# Patient Record
Sex: Female | Born: 1985 | Race: Black or African American | Hispanic: No | Marital: Married | Smoking: Never smoker
Health system: Southern US, Community
[De-identification: ages and names within clinical notes are randomized; demographics above are authoritative.]

## PROBLEM LIST (undated history)

## (undated) ENCOUNTER — Inpatient Hospital Stay (HOSPITAL_COMMUNITY): Payer: Self-pay

## (undated) DIAGNOSIS — Z789 Other specified health status: Secondary | ICD-10-CM

## (undated) HISTORY — PX: NO PAST SURGERIES: SHX2092

---

## 2008-02-28 ENCOUNTER — Inpatient Hospital Stay (HOSPITAL_COMMUNITY): Admission: AD | Admit: 2008-02-28 | Discharge: 2008-03-01 | Payer: Self-pay | Admitting: Obstetrics and Gynecology

## 2011-01-02 NOTE — H&P (Signed)
NAMESUEANN, BROWNLEY                   ACCOUNT NO.:  [id number]   MEDICAL RECORD NO.:  [id number]          PATIENT TYPE:  MAT   LOCATION:  [facility]                          FACILITY:  [facility]   PHYSICIAN:  Osborn Coho, M.D.   DATE OF BIRTH:  05-Feb-1986   DATE OF ADMISSION:  02/28/2008  DATE OF DISCHARGE:                              HISTORY & PHYSICAL   HISTORY:  Ms. Kuri is a 25 year old married black female primigravida  at 48 and 6/7th weeks who presents with leaking clear fluid since 3.45  a.m. with onset of mild contraction since then.  She denies bleeding.  Denies nausea, vomiting, or diarrhea.  No headaches.  Her pregnancy has  been followed by the [facility] Cert[facility]-Midwife  [facility] and has been essentially unremarkable.  Her group B strep status  is negative.   Her prenatal labs were collected on September 12, 2007.  Hemoglobin 12.2,  hematocrit 35.9, platelets 247,000, blood type O positive, antibody  negative, sickle cell trait negative, rubella immune, hepatitis B  surface antigen negative, HIV nonreactive.  Pap smear was within normal  limits.  Gonorrhea negative.  Chlamydia negative.  One-hour Glucola from  December 08, 2007, was 38.  Culture of the vaginal tract for group B strep  in the third trimester is negative.   HISTORY OF PRESENT PREGNANCY:  The patient presented for care at [facility] on September 12, 2007 at 14-5/7th weeks' gestation.  Ultrasound  was done with that visit for dating purposes and best EDC was  established at March 07, 2008.  Anatomy ultrasound at 18-1/2 weeks' shows  growth consistent with previous dating, all anatomy was seen.  Rest of  her prenatal care has been unremarkable.  She has remained size equal to  dates and normotensive with no proteinuria throughout the pregnancy.   OB HISTORY:  She is a primigravida.   PAST MEDICAL HISTORY:  She has no medication allergies.  She experienced  menarche at the age 57 with 30 days  cycles lasting 7 days.  She has  taken Yaz in the past for contraception and stopped in September 2008.  She was vaccinated against the chickenpox but has never had them.   SURGICAL HISTORY:  Negative.   FAMILY MEDICAL HISTORY:  Father is on medication for chronic  hypertension.  Maternal grandmother is deceased but did have diabetes  requiring insulin.   GENETIC HISTORY:  Negative.   SOCIAL HISTORY:  The patient is married.  Father of the baby's name is  [place].  The patient is college educated and is a part-time  Scientist, research (medical).  The father of the baby has a Higher education careers adviser and works  full time for an Leisure centre manager.  They deny any alcohol, tobacco,  or illicit drug use with the pregnancy.   OBJECTIVE DATA:  Vital signs are stable.  She is afebrile.  HEENT:  Grossly within normal limits.  CHEST:  Clear to auscultation.  HEART:  Regular rate and rhythm.  ABDOMEN:  Gravid in contour with fundal height extending approximately  38-cm above the pubic  symphysis.  Fetal heart rate is reactive and  reassuring.  Contractions every 3-4 minutes and very mild.  Cervix is 2  cm, 90% vertex, -2 with clear fluid, positive for Nitrazine and positive  for ferning.  EXTREMITIES:  Normal.   ASSESSMENT:  1. Intrauterine pregnancy at term.  2. Premature rupture of membranes.  3. Group B streptococcus negative.   PLAN:  1. Plan is to admit to birthing suite.  2. Routine standing orders.  3. Plan expectant management for now.  4. Would like epidural as labor progresses.      Cam Hai, C.N.M.      Osborn Coho, M.D.  Electronically Signed    KS/MEDQ  D:  02/28/2008  T:  02/28/2008  Job:  [id number]

## 2011-05-17 LAB — CBC
HCT: 42.3
Hemoglobin: 10.9 — ABNORMAL LOW
Hemoglobin: 14.4
MCHC: 33.6
MCV: 93.9
RBC: 3.46 — ABNORMAL LOW
RBC: 4.56
WBC: 17 — ABNORMAL HIGH
WBC: 17.5 — ABNORMAL HIGH

## 2012-01-01 ENCOUNTER — Encounter: Payer: Self-pay | Admitting: Obstetrics and Gynecology

## 2012-01-02 ENCOUNTER — Encounter: Payer: Self-pay | Admitting: Obstetrics and Gynecology

## 2012-01-02 ENCOUNTER — Ambulatory Visit (INDEPENDENT_AMBULATORY_CARE_PROVIDER_SITE_OTHER): Payer: 59 | Admitting: Obstetrics and Gynecology

## 2012-01-02 VITALS — BP 100/70 | HR 72 | Ht 66.0 in | Wt 141.0 lb

## 2012-01-02 DIAGNOSIS — Z9189 Other specified personal risk factors, not elsewhere classified: Secondary | ICD-10-CM

## 2012-01-02 DIAGNOSIS — Z01419 Encounter for gynecological examination (general) (routine) without abnormal findings: Secondary | ICD-10-CM

## 2012-01-02 DIAGNOSIS — Z87448 Personal history of other diseases of urinary system: Secondary | ICD-10-CM

## 2012-01-02 DIAGNOSIS — Z124 Encounter for screening for malignant neoplasm of cervix: Secondary | ICD-10-CM

## 2012-01-02 DIAGNOSIS — Z113 Encounter for screening for infections with a predominantly sexual mode of transmission: Secondary | ICD-10-CM

## 2012-01-02 DIAGNOSIS — I10 Essential (primary) hypertension: Secondary | ICD-10-CM

## 2012-01-02 MED ORDER — DROSPIRENONE-ETHINYL ESTRADIOL 3-0.02 MG PO TABS: 1.0000 | ORAL_TABLET | Freq: Every day | ORAL | Status: DC

## 2012-01-02 NOTE — Progress Notes (Signed)
Regular Periods: yes Mammogram: no  Monthly Breast Ex.: yes Exercise: yes  Tetanus < 10 years: no Seatbelts: yes  NI. Bladder Functn.: yes Abuse at home: no  Daily BM's: yes Stressful Work: yes  Healthy Diet: yes Sigmoid-Colonoscopy: no  Calcium: no Medical problems this year: no problem   LAST PAP:6/12 nl  Contraception: GIANVI  Mammogram:  NEVER  PCP: NO  PMH: NO CHANGE  FMH: NO CHANGE  Last Bone Scan: NEVER

## 2012-01-02 NOTE — Progress Notes (Signed)
Subjective:    Susan Fleming is a 26 y.o. female, G1P1, who presents for an annual exam. The patient reports no complaints.  Wants to continue Gianvi.  Menstrual cycle:   LMP: Patient's last menstrual period was 12/22/2011. normal flow per patient             Review of Systems Pertinent items are noted in HPI. Denies pelvic pain, uti symptoms, vaginitis symptoms, irregular bleeding, menopausal symptoms, change in bowel habits or rectal bleeding   Objective:    BP 100/70  Pulse 72  Ht 5\' 6"  (1.676 m)  Wt 141 lb (63.957 kg)  BMI 22.76 kg/m2  LMP 12/22/2011  Wt Readings from Last 1 Encounters:  01/02/12 141 lb (63.957 kg)  : Body mass index is 22.76 kg/(m^2). General Appearance: Alert, appropriate appearance for age. No acute distress HEENT: Grossly normal Neck / Thyroid: Supple, no thyromegaly or cervical adenopathy Lungs: clear to auscultation bilaterally Back: No CVA tenderness Breast Exam: No masses or nodes.No dimpling, nipple retraction or discharge. Cardiovascular: Regular rate and rhythm.  Gastrointestinal: Soft, non-tender, no masses or organomegaly Pelvic Exam: EGBUS-wnl, vagina-normal rugae, cervix- without lesions or tenderness, uterus appears normal size shape and consistency, adnexae-no masses or tenderness Lymphatic Exam: Non-palpable nodes in neck,  axillary, or inguinal regions  Skin: no rashes or abnormalities Extremities: no clubbing cyanosis or edema  Neurologic: grossly normal Psychiatric: Alert and oriented    Assessment:   Routine GYN Exam   Plan:    PAP-sent STD testing Continue Gianvi #3 1 po qd 4 refills   Meeah Totino,ELMIRAPA-C

## 2012-01-03 LAB — HEPATITIS B SURFACE ANTIGEN: Hepatitis B Surface Ag: NEGATIVE

## 2012-01-03 LAB — HIV ANTIBODY (ROUTINE TESTING W REFLEX): HIV: NONREACTIVE

## 2012-01-05 LAB — PAP IG, CT-NG, RFX HPV ASCU
Chlamydia Probe Amp: NEGATIVE
GC Probe Amp: NEGATIVE

## 2012-01-15 ENCOUNTER — Telehealth: Payer: Self-pay | Admitting: Obstetrics and Gynecology

## 2012-01-15 NOTE — Telephone Encounter (Signed)
Triage/elect. 

## 2012-01-15 NOTE — Telephone Encounter (Signed)
INFORMED PT THAT ALL OF TEST RESULTS WERE NORMAL. PT VOICED UNDERSTANDING  LM

## 2012-03-10 ENCOUNTER — Other Ambulatory Visit: Payer: Self-pay | Admitting: Obstetrics and Gynecology

## 2012-03-10 ENCOUNTER — Other Ambulatory Visit: Payer: Self-pay

## 2012-03-11 ENCOUNTER — Other Ambulatory Visit: Payer: Self-pay | Admitting: Obstetrics and Gynecology

## 2012-03-11 MED ORDER — DROSPIRENONE-ETHINYL ESTRADIOL 3-0.02 MG PO TABS: 1.0000 | ORAL_TABLET | Freq: Every day | ORAL | Status: DC

## 2012-03-11 NOTE — Telephone Encounter (Signed)
Pt called, states [facility] says that her rx was cancelled by ordering provider so she did not have any rfs remaining.  Pt had her Aex in 12/2011 and was given a rx for a year.  Rx for Loestrin [facility] for pt until next Aex.

## 2012-03-11 NOTE — Telephone Encounter (Signed)
Triage/epic 

## 2012-08-04 ENCOUNTER — Telehealth: Payer: Self-pay | Admitting: Obstetrics and Gynecology

## 2012-08-04 NOTE — Telephone Encounter (Signed)
TC to pt. States has vaginal odor.  Scheduled with SL for eval 08/05/12.

## 2012-08-05 ENCOUNTER — Encounter: Payer: Self-pay | Admitting: Obstetrics and Gynecology

## 2012-08-05 ENCOUNTER — Ambulatory Visit (INDEPENDENT_AMBULATORY_CARE_PROVIDER_SITE_OTHER): Payer: 59 | Admitting: Obstetrics and Gynecology

## 2012-08-05 ENCOUNTER — Encounter: Payer: 59 | Admitting: Obstetrics and Gynecology

## 2012-08-05 VITALS — BP 120/72 | Resp 16 | Wt 138.0 lb

## 2012-08-05 DIAGNOSIS — N898 Other specified noninflammatory disorders of vagina: Secondary | ICD-10-CM

## 2012-08-05 DIAGNOSIS — N949 Unspecified condition associated with female genital organs and menstrual cycle: Secondary | ICD-10-CM

## 2012-08-05 DIAGNOSIS — Z0000 Encounter for general adult medical examination without abnormal findings: Secondary | ICD-10-CM

## 2012-08-05 LAB — HEPATITIS C ANTIBODY: HCV Ab: NEGATIVE

## 2012-08-05 LAB — HIV ANTIBODY (ROUTINE TESTING W REFLEX): HIV: NONREACTIVE

## 2012-08-05 MED ORDER — METRONIDAZOLE 500 MG PO TABS: 500.0000 mg | ORAL_TABLET | Freq: Two times a day (BID) | ORAL | Status: DC

## 2012-08-05 NOTE — Patient Instructions (Signed)
Bacterial Vaginosis Bacterial vaginosis (BV) is a vaginal infection where the normal balance of bacteria in the vagina is disrupted. The normal balance is then replaced by an overgrowth of certain bacteria. There are several different kinds of bacteria that can cause BV. BV is the most common vaginal infection in women of childbearing age. CAUSES   The cause of BV is not fully understood. BV develops when there is an increase or imbalance of harmful bacteria.  Some activities or behaviors can upset the normal balance of bacteria in the vagina and put women at increased risk including:  Having a new sex partner or multiple sex partners.  Douching.  Using an intrauterine device (IUD) for contraception.  It is not clear what role sexual activity plays in the development of BV. However, women that have never had sexual intercourse are rarely infected with BV. Women do not get BV from toilet seats, bedding, swimming pools or from touching objects around them.  SYMPTOMS   Grey vaginal discharge.  A fish-like odor with discharge, especially after sexual intercourse.  Itching or burning of the vagina and vulva.  Burning or pain with urination.  Some women have no signs or symptoms at all. DIAGNOSIS  Your caregiver must examine the vagina for signs of BV. Your caregiver will perform lab tests and look at the sample of vaginal fluid through a microscope. They will look for bacteria and abnormal cells (clue cells), a pH test higher than 4.5, and a positive amine test all associated with BV.  RISKS AND COMPLICATIONS   Pelvic inflammatory disease (PID).  Infections following gynecology surgery.  Developing HIV.  Developing herpes virus. TREATMENT  Sometimes BV will clear up without treatment. However, all women with symptoms of BV should be treated to avoid complications, especially if gynecology surgery is planned. Female partners generally do not need to be treated. However, BV may spread  between female sex partners so treatment is helpful in preventing a recurrence of BV.   BV may be treated with antibiotics. The antibiotics come in either pill or vaginal cream forms. Either can be used with nonpregnant or pregnant women, but the recommended dosages differ. These antibiotics are not harmful to the baby.  BV can recur after treatment. If this happens, a second round of antibiotics will often be prescribed.  Treatment is important for pregnant women. If not treated, BV can cause a premature delivery, especially for a pregnant woman who had a premature birth in the past. All pregnant women who have symptoms of BV should be checked and treated.  For chronic reoccurrence of BV, treatment with a type of prescribed gel vaginally twice a week is helpful. HOME CARE INSTRUCTIONS   Finish all medication as directed by your caregiver.  Do not have sex until treatment is completed.  Tell your sexual partner that you have a vaginal infection. They should see their caregiver and be treated if they have problems, such as a mild rash or itching.  Practice safe sex. Use condoms. Only have 1 sex partner. PREVENTION  Basic prevention steps can help reduce the risk of upsetting the natural balance of bacteria in the vagina and developing BV:  Do not have sexual intercourse (be abstinent).  Do not douche.  Use all of the medicine prescribed for treatment of BV, even if the signs and symptoms go away.  Tell your sex partner if you have BV. That way, they can be treated, if needed, to prevent reoccurrence. SEEK MEDICAL CARE IF:     Your symptoms are not improving after 3 days of treatment.  You have increased discharge, pain, or fever. MAKE SURE YOU:   Understand these instructions.  Will watch your condition.  Will get help right away if you are not doing well or get worse. FOR MORE INFORMATION  Division of STD Prevention (DSTDP), Centers for Disease Control and Prevention:  www.cdc.gov/std American Social Health Association (ASHA): www.ashastd.org  Document Released: 08/06/2005 Document Revised: 10/29/2011 Document Reviewed: 01/27/2009 ExitCare Patient Information 2013 [employer], LLC.  

## 2012-08-05 NOTE — Progress Notes (Signed)
HISTORY OF PRESENT ILLNESS  [name] is a 26 y.o. year old female,G1P1, who presents for a problem visit. The patient complains of a vaginal discharge with a vaginal odor.  She wants to rule out sexual transmitted infections. She has a history of a false positive hepatitis C.  Objective:  BP 120/72  Resp 16  Wt 138 lb (62.596 kg)  LMP 07/26/2012   General: no distress GI: soft and nontender  External genitalia: normal general appearance Vaginal: normal without tenderness, induration or masses Cervix: normal appearance Adnexa: normal bimanual exam Uterus: normal size, nontender  Wet prep: Positive clue cells, pH 4.5, Whiff neg  Assessment:  Bacterial vaginosis  Rule out sexual transmitted infections  Plan:  Metronidazole 500 mg twice each day for 7 days  GC, Chlamydia, HIV, HBsAg, hepatitis C, RPR, HSV 1, HSV-2  Return to office prn if symptoms worsen or fail to improve.   Susan Fleming M.D.  08/05/2012 10:02 PM    Vaginal discharge: whitethick mucoid Itching / Burning: no Fever: no  Symptoms have been present for 1 month off and on. Has used over-the-counter treatment: douche Associated symptoms:  Pelvic pain: no       Dyspareunia: no     Odor:  yes  History of STD:  no history of PID, STD's STD screen:requested

## 2012-08-06 LAB — HSV 2 ANTIBODY, IGG: HSV 2 Glycoprotein G Ab, IgG: 0.3 IV

## 2012-08-06 LAB — GC/CHLAMYDIA PROBE AMP: GC Probe RNA: NEGATIVE

## 2012-08-22 ENCOUNTER — Telehealth: Payer: Self-pay | Admitting: Obstetrics and Gynecology

## 2012-08-22 NOTE — Telephone Encounter (Signed)
Spoke with pt rgd results informed labs wnl pt voice understanding

## 2012-08-25 ENCOUNTER — Telehealth: Payer: Self-pay | Admitting: Obstetrics and Gynecology

## 2012-08-25 NOTE — Telephone Encounter (Signed)
APT SCHEDULED FOR Friday 09/05/12 WITH EP.  Darien Ramus, CMA

## 2012-09-05 ENCOUNTER — Encounter: Payer: 59 | Admitting: Obstetrics and Gynecology

## 2013-12-17 ENCOUNTER — Ambulatory Visit (INDEPENDENT_AMBULATORY_CARE_PROVIDER_SITE_OTHER): Payer: BC Managed Care – PPO | Admitting: Internal Medicine

## 2013-12-17 ENCOUNTER — Encounter: Payer: Self-pay | Admitting: Internal Medicine

## 2013-12-17 VITALS — BP 134/77 | HR 118 | Temp 98.5°F | Ht 66.0 in | Wt 167.0 lb

## 2013-12-17 DIAGNOSIS — Z789 Other specified health status: Secondary | ICD-10-CM

## 2013-12-17 NOTE — Progress Notes (Signed)
   Subjective:    Patient ID: Susan Fleming, female    DOB: 06/16/1986, 28 y.o.   MRN: [id number]  HPI She comes in for a new patient evaluation of hepatitis B serology.  She is under the care of Nigel BridgemanVicki Latham, CNM and trying to get pregnant.  She had a positive hepatitis B surface Ab.  She has a history of hepatitis B surface Ag negative test x 2 in 2013.  She recalls a vaccine series in middle school, not sure of what.  Born [place].  No hepatitis known in family.  Feels well.  Husband tested negative for hepatitis B.  No travel history.  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     Review of Systems  Constitutional: Negative for fatigue and unexpected weight change.  HENT:       No icterus  Gastrointestinal: Negative for abdominal pain and diarrhea.  Skin:       No jaundice       Objective:   Physical Exam  Constitutional: She appears well-developed and well-nourished. No distress.  Eyes: No scleral icterus.  Neurological: She is alert.  Skin: No rash noted.  Psychiatric: She has a normal mood and affect. Her behavior is normal.          Assessment & Plan:

## 2013-12-17 NOTE — Assessment & Plan Note (Signed)
Hepatitis B immune with positive antibody.  Likely from successful vaccine series.  Will check core Ab to see if remote disease.  Had SurfaceAg negative in 2013 x 2 so I would doubt she has acquired a new infection but will repeat Ag again to confirm.    Return PRN

## 2013-12-18 ENCOUNTER — Telehealth: Payer: Self-pay | Admitting: *Deleted

## 2013-12-18 LAB — HEPATITIS B CORE ANTIBODY, TOTAL: HEP B C TOTAL AB: NONREACTIVE

## 2013-12-18 LAB — HEPATITIS B SURFACE ANTIGEN: Hepatitis B Surface Ag: NEGATIVE

## 2013-12-18 NOTE — Telephone Encounter (Signed)
Patient notified, copy of result mailed to her and copy faxed to referring provider Erin SonsVickie Latham at [phone]. Wendall MolaJacqueline Cockerham

## 2013-12-18 NOTE — Telephone Encounter (Signed)
Message copied by Macy MisOCKERHAM, JACQUELINE A on Fri Dec 18, 2013 12:10 PM ------      Message from: Gardiner BarefootOMER, ROBERT W      Created: Fri Dec 18, 2013 11:23 AM       Please let her know that her hepatitis B tests confirm that she does not have active nor old hepatitis B infection and her initial test represents immunity from previous vaccine only, as expected.             Thanks ------

## 2013-12-29 ENCOUNTER — Ambulatory Visit: Payer: Self-pay | Admitting: Internal Medicine

## 2014-01-04 LAB — OB RESULTS CONSOLE ABO/RH: RH TYPE: POSITIVE

## 2014-01-04 LAB — OB RESULTS CONSOLE GC/CHLAMYDIA
Chlamydia: NEGATIVE
Gonorrhea: NEGATIVE

## 2014-01-04 LAB — OB RESULTS CONSOLE RUBELLA ANTIBODY, IGM: Rubella: IMMUNE

## 2014-01-08 ENCOUNTER — Encounter: Payer: Self-pay | Admitting: Internal Medicine

## 2014-01-12 ENCOUNTER — Telehealth: Payer: Self-pay | Admitting: *Deleted

## 2014-01-12 NOTE — Telephone Encounter (Signed)
Referral sent from patient's OB for hepatitis B. Patient has already been seen by Dr. Luciana Axe and does not have Hepatitis B, just positive antibody from vaccine. Office notified and office note and labs refaxed to Erin Sons, midwife. Wendall Mola

## 2014-01-15 ENCOUNTER — Inpatient Hospital Stay (HOSPITAL_COMMUNITY): Payer: BC Managed Care – PPO

## 2014-01-15 ENCOUNTER — Encounter (HOSPITAL_COMMUNITY): Payer: Self-pay | Admitting: *Deleted

## 2014-01-15 ENCOUNTER — Inpatient Hospital Stay (HOSPITAL_COMMUNITY)
Admission: AD | Admit: 2014-01-15 | Discharge: 2014-01-16 | Disposition: A | Discharge status: Home / Self Care | Payer: BC Managed Care – PPO | Source: Ambulatory Visit | Attending: Obstetrics and Gynecology | Admitting: Obstetrics and Gynecology

## 2014-01-15 DIAGNOSIS — O209 Hemorrhage in early pregnancy, unspecified: Secondary | ICD-10-CM | POA: Insufficient documentation

## 2014-01-15 HISTORY — DX: Other specified health status: Z78.9

## 2014-01-15 LAB — WET PREP, GENITAL
Trich, Wet Prep: NONE SEEN
YEAST WET PREP: NONE SEEN

## 2014-01-15 NOTE — MAU Provider Note (Signed)
History    Patient is a 28yo G2P1001 at 8wks based on LMP of 11/21/13 who presents with c/o vaginal bleeding this pm.  Patient states that she went to the bathroom and upon wiping noted some bright red blood on the tissue.  Patient denies current or recent abdominal cramping as well as sexual intercourse.  Patient states that she wore a pad to hospital, but it was not stained with any blood.  Patient denies recent illness, GI, and urination issues.    Patient Active Problem List   Diagnosis Date Noted  . Hepatitis B immune 12/17/2013    Chief Complaint  Patient presents with  . Vaginal Bleeding   HPI  OB History   Grav Para Term Preterm Abortions TAB SAB Ect Mult Living   2 1 1       1       Past Medical History  Diagnosis Date  . Medical history non-contributory     Past Surgical History  Procedure Laterality Date  . No past surgeries      History reviewed. No pertinent family history.  History  Substance Use Topics  . Smoking status: Never Smoker   . Smokeless tobacco: Never Used  . Alcohol Use: No    Allergies: No Known Allergies  Prescriptions prior to admission  Medication Sig Dispense Refill  . acetaminophen (TYLENOL) 325 MG tablet Take 650 mg by mouth every 6 (six) hours as needed.      . Prenatal Vit-Fe Fumarate-FA (PRENATAL MULTIVITAMIN) TABS tablet Take 1 tablet by mouth daily at 12 noon.      . Biotin 10 MG TABS Take by mouth.      . drospirenone-ethinyl estradiol (YAZ,GIANVI,LORYNA) 3-0.02 MG tablet Take 1 tablet by mouth daily.  3 Package  3  . Methylsulfonylmethane (MSM PO) Take 200 mg by mouth once.      . metroNIDAZOLE (FLAGYL) 500 MG tablet Take 1 tablet (500 mg total) by mouth 2 (two) times daily. Take tablets  for 7 days  14 tablet  0    ROS  See HPI Above Physical Exam   Blood pressure 157/67, pulse 106, temperature 98.9 F (37.2 C), resp. rate 18, height 5\' 6"  (1.676 m), weight 168 lb (76.204 kg), last menstrual period 11/21/2013. Results for  orders placed during the hospital encounter of 01/15/14 (from the past 24 hour(s))  WET PREP, GENITAL     Status: Abnormal   Collection Time    01/15/14 10:00 PM      Result Value Ref Range   Yeast Wet Prep HPF POC NONE SEEN  NONE SEEN   Trich, Wet Prep NONE SEEN  NONE SEEN   Clue Cells Wet Prep HPF POC FEW (*) NONE SEEN   WBC, Wet Prep HPF POC MODERATE (*) NONE SEEN    Physical Exam  ED Course  Assessment: IUP at 8wks Vaginal Bleeding  Plan: -Labs: Gc/CT, Wet Prep -[place] for Viability   Follow Up (0010) -Preliminary [place] viewed-FHR of 178 noted -Bacterial Vaginosis noted on wet prep, RX sent -Bleeding precautions discussed -Patient to follow up in office as scheduled -Call if you have any questions or concerns prior to your next visit.    Gerrit Heck CNM, MSN 01/15/2014 9:52 PM

## 2014-01-15 NOTE — MAU Note (Signed)
I went to [facility] an hour ago and saw bright red blood on tissue when i wiped. NO pain

## 2014-01-16 LAB — GC/CHLAMYDIA PROBE AMP
CT Probe RNA: NEGATIVE
GC Probe RNA: NEGATIVE

## 2014-01-16 MED ORDER — METRONIDAZOLE 500 MG PO TABS
500.0000 mg | ORAL_TABLET | Freq: Two times a day (BID) | ORAL | Status: DC
Start: 2014-01-16 — End: 2014-08-26

## 2014-01-16 NOTE — Discharge Instructions (Signed)
Bacterial Vaginosis Bacterial vaginosis is a vaginal infection that occurs when the normal balance of bacteria in the vagina is disrupted. It results from an overgrowth of certain bacteria. This is the most common vaginal infection in women of childbearing age. Treatment is important to prevent complications, especially in pregnant women, as it can cause a premature delivery. CAUSES  Bacterial vaginosis is caused by an increase in harmful bacteria that are normally present in smaller amounts in the vagina. Several different kinds of bacteria can cause bacterial vaginosis. However, the reason that the condition develops is not fully understood. RISK FACTORS Certain activities or behaviors can put you at an increased risk of developing bacterial vaginosis, including:  Having a new sex partner or multiple sex partners.  Douching.  Using an intrauterine device (IUD) for contraception. Women do not get bacterial vaginosis from toilet seats, bedding, swimming pools, or contact with objects around them. SIGNS AND SYMPTOMS  Some women with bacterial vaginosis have no signs or symptoms. Common symptoms include:  Grey vaginal discharge.  A fishlike odor with discharge, especially after sexual intercourse.  Itching or burning of the vagina and vulva.  Burning or pain with urination. DIAGNOSIS  Your health care provider will take a medical history and examine the vagina for signs of bacterial vaginosis. A sample of vaginal fluid may be taken. Your health care provider will look at this sample under a microscope to check for bacteria and abnormal cells. A vaginal pH test may also be done.  TREATMENT  Bacterial vaginosis may be treated with antibiotic medicines. These may be given in the form of a pill or a vaginal cream. A second round of antibiotics may be prescribed if the condition comes back after treatment.  HOME CARE INSTRUCTIONS   Only take over-the-counter or prescription medicines as  directed by your health care provider.  If antibiotic medicine was prescribed, take it as directed. Make sure you finish it even if you start to feel better.  Do not have sex until treatment is completed.  Tell all sexual partners that you have a vaginal infection. They should see their health care provider and be treated if they have problems, such as a mild rash or itching.  Practice safe sex by using condoms and only having one sex partner. SEEK MEDICAL CARE IF:   Your symptoms are not improving after 3 days of treatment.  You have increased discharge or pain.  You have a fever. MAKE SURE YOU:   Understand these instructions.  Will watch your condition.  Will get help right away if you are not doing well or get worse. FOR MORE INFORMATION  Centers for Disease Control and Prevention, Division of STD Prevention: SolutionApps.co.zawww.cdc.gov/std American Sexual Health Association (ASHA): www.ashastd.org  Document Released: 08/06/2005 Document Revised: 05/27/2013 Document Reviewed: 03/18/2013 [facility] Patient Information 2014 [place], [place]. Vaginal Bleeding During Pregnancy, First Trimester A small amount of bleeding (spotting) from the vagina is relatively common in early pregnancy. It usually stops on its own. Various things may cause bleeding or spotting in early pregnancy. Some bleeding may be related to the pregnancy, and some may not. In most cases, the bleeding is normal and is not a problem. However, bleeding can also be a sign of something serious. Be sure to tell your health care provider about any vaginal bleeding right away. Some possible causes of vaginal bleeding during the first trimester include:  Infection or inflammation of the cervix.  Growths (polyps) on the cervix.  Miscarriage or threatened miscarriage.  Pregnancy tissue has developed outside of the uterus and in a fallopian tube (tubal pregnancy).  Tiny cysts have developed in the uterus instead of pregnancy tissue  (molar pregnancy). HOME CARE INSTRUCTIONS  Watch your condition for any changes. The following actions may help to lessen any discomfort you are feeling:  Follow your health care provider's instructions for limiting your activity. If your health care provider orders bed rest, you may need to stay in bed and only get up to use the bathroom. However, your health care provider may allow you to continue light activity.  If needed, make plans for someone to help with your regular activities and responsibilities while you are on bed rest.  Keep track of the number of pads you use each day, how often you change pads, and how soaked (saturated) they are. Write this down.  Do not use tampons. Do not douche.  Do not have sexual intercourse or orgasms until approved by your health care provider.  If you pass any tissue from your vagina, save the tissue so you can show it to your health care provider.  Only take over-the-counter or prescription medicines as directed by your health care provider.  Do not take aspirin because it can make you bleed.  Keep all follow-up appointments as directed by your health care provider. SEEK MEDICAL CARE IF:  You have any vaginal bleeding during any part of your pregnancy.  You have cramps or labor pains. SEEK IMMEDIATE MEDICAL CARE IF:   You have severe cramps in your back or belly (abdomen).  You have a fever, not controlled by medicine.  You pass large clots or tissue from your vagina.  Your bleeding increases.  You feel lightheaded or weak, or you have fainting episodes.  You have chills.  You are leaking fluid or have a gush of fluid from your vagina.  You pass out while having a bowel movement. MAKE SURE YOU:  Understand these instructions.  Will watch your condition.  Will get help right away if you are not doing well or get worse. Document Released: 05/16/2005 Document Revised: 05/27/2013 Document Reviewed: 04/13/2013 [facility] Patient  Information 2014 [place], [place].

## 2014-01-20 ENCOUNTER — Ambulatory Visit: Payer: BC Managed Care – PPO | Admitting: Internal Medicine

## 2014-05-28 LAB — OB RESULTS CONSOLE RPR: RPR: NONREACTIVE

## 2014-05-28 LAB — OB RESULTS CONSOLE HIV ANTIBODY (ROUTINE TESTING): HIV: NONREACTIVE

## 2014-06-21 ENCOUNTER — Encounter (HOSPITAL_COMMUNITY): Payer: Self-pay | Admitting: *Deleted

## 2014-08-20 NOTE — L&D Delivery Note (Signed)
Operative Delivery Note At 10:26 AM a viable female was delivered via Vaginal, Vacuum Investment banker, operational(Extractor).  Presentation: vertex; Position: Left,, Occiput,, Anterior; Station: +2.  Verbal consent: obtained from patient.  Risks and benefits discussed in detail.  Risks include, but are not limited to the risks of anesthesia, bleeding, infection, damage to maternal tissues, fetal cephalhematoma.  There is also the risk of inability to effect vaginal delivery of the head, or shoulder dystocia that cannot be resolved by established maneuvers, leading to the need for emergency cesarean section.  Vacuum pumped to a pressure of 50mmHg in green zone.  Baby delivered with vacuum assistance with one push.  Indication for vacuum assistance decel/bradycardia.  APGAR: 9, 9; weight hasn't been weighed yet. Placenta status: Intact, Spontaneous.   Cord: 3 vessels with the following complications: None.  Cord pH: n/a  Anesthesia: Epidural  Instruments: Vacuum Episiotomy: None Lacerations: Periurethral Suture Repair: 4-0 vicryl Est. Blood Loss (mL):  500cc Mom to postpartum.  Baby to Couplet care / Skin to Skin.  Kathleen Likins Y 08/26/2014, 10:50 AM

## 2014-08-26 ENCOUNTER — Encounter (HOSPITAL_COMMUNITY): Payer: Self-pay | Admitting: *Deleted

## 2014-08-26 ENCOUNTER — Inpatient Hospital Stay (HOSPITAL_COMMUNITY): Payer: 59 | Admitting: Anesthesiology

## 2014-08-26 ENCOUNTER — Inpatient Hospital Stay (HOSPITAL_COMMUNITY)
Admission: AD | Admit: 2014-08-26 | Discharge: 2014-08-28 | DRG: 775 | Disposition: A | Discharge status: Home / Self Care | Payer: 59 | Source: Ambulatory Visit | Attending: Obstetrics & Gynecology | Admitting: Obstetrics & Gynecology

## 2014-08-26 DIAGNOSIS — Z3A39 39 weeks gestation of pregnancy: Secondary | ICD-10-CM | POA: Diagnosis present

## 2014-08-26 LAB — CBC
HCT: 38.3 % (ref 36.0–46.0)
Hemoglobin: 13.8 g/dL (ref 12.0–15.0)
MCH: 32 pg (ref 26.0–34.0)
MCHC: 36 g/dL (ref 30.0–36.0)
MCV: 88.9 fL (ref 78.0–100.0)
PLATELETS: 133 10*3/uL — AB (ref 150–400)
RBC: 4.31 MIL/uL (ref 3.87–5.11)
RDW: 14.7 % (ref 11.5–15.5)
WBC: 18.6 10*3/uL — ABNORMAL HIGH (ref 4.0–10.5)

## 2014-08-26 LAB — TYPE AND SCREEN
ABO/RH(D): O POS
Antibody Screen: NEGATIVE

## 2014-08-26 LAB — OB RESULTS CONSOLE GBS: GBS: NEGATIVE

## 2014-08-26 LAB — ABO/RH: ABO/RH(D): O POS

## 2014-08-26 LAB — RPR

## 2014-08-26 MED ORDER — DIPHENHYDRAMINE HCL 50 MG/ML IJ SOLN: 12.5000 mg | INTRAMUSCULAR | Status: DC | PRN

## 2014-08-26 MED ORDER — EPHEDRINE 5 MG/ML INJ
10.0000 mg | INTRAVENOUS | Status: DC | PRN
  Filled 2014-08-26: qty 2

## 2014-08-26 MED ORDER — PHENYLEPHRINE 40 MCG/ML (10ML) SYRINGE FOR IV PUSH (FOR BLOOD PRESSURE SUPPORT)
80.0000 ug | PREFILLED_SYRINGE | INTRAVENOUS | Status: DC | PRN
  Filled 2014-08-26: qty 20
  Filled 2014-08-26: qty 2

## 2014-08-26 MED ORDER — ZOLPIDEM TARTRATE 5 MG PO TABS: 5.0000 mg | ORAL_TABLET | Freq: Every evening | ORAL | Status: DC | PRN

## 2014-08-26 MED ORDER — LACTATED RINGERS IV SOLN
500.0000 mL | Freq: Once | INTRAVENOUS | Status: AC
  Administered 2014-08-26: 500 mL via INTRAVENOUS

## 2014-08-26 MED ORDER — DIBUCAINE 1 % RE OINT: 1.0000 "application " | TOPICAL_OINTMENT | RECTAL | Status: DC | PRN

## 2014-08-26 MED ORDER — LACTATED RINGERS IV SOLN: 500.0000 mL | INTRAVENOUS | Status: DC | PRN

## 2014-08-26 MED ORDER — LIDOCAINE HCL (PF) 1 % IJ SOLN
30.0000 mL | INTRAMUSCULAR | Status: AC | PRN
  Administered 2014-08-26: 30 mL via SUBCUTANEOUS
  Filled 2014-08-26: qty 30

## 2014-08-26 MED ORDER — BENZOCAINE-MENTHOL 20-0.5 % EX AERO
1.0000 "application " | INHALATION_SPRAY | CUTANEOUS | Status: DC | PRN
  Administered 2014-08-26: 1 via TOPICAL
  Filled 2014-08-26: qty 56

## 2014-08-26 MED ORDER — OXYCODONE-ACETAMINOPHEN 5-325 MG PO TABS: 1.0000 | ORAL_TABLET | ORAL | Status: DC | PRN

## 2014-08-26 MED ORDER — NALBUPHINE HCL 10 MG/ML IJ SOLN: 5.0000 mg | INTRAMUSCULAR | Status: DC | PRN

## 2014-08-26 MED ORDER — OXYCODONE-ACETAMINOPHEN 5-325 MG PO TABS
2.0000 | ORAL_TABLET | ORAL | Status: DC | PRN
Start: 2014-08-26 — End: 2014-08-26

## 2014-08-26 MED ORDER — LIDOCAINE HCL (PF) 1 % IJ SOLN
INTRAMUSCULAR | Status: DC | PRN
  Administered 2014-08-26: 4 mL
  Administered 2014-08-26: 6 mL

## 2014-08-26 MED ORDER — ONDANSETRON HCL 4 MG/2ML IJ SOLN: 4.0000 mg | Freq: Four times a day (QID) | INTRAMUSCULAR | Status: DC | PRN

## 2014-08-26 MED ORDER — OXYTOCIN 40 UNITS IN LACTATED RINGERS INFUSION - SIMPLE MED
62.5000 mL/h | INTRAVENOUS | Status: DC
  Administered 2014-08-26: 999 mL/h via INTRAVENOUS
  Filled 2014-08-26: qty 1000

## 2014-08-26 MED ORDER — SENNOSIDES-DOCUSATE SODIUM 8.6-50 MG PO TABS
2.0000 | ORAL_TABLET | ORAL | Status: DC
  Administered 2014-08-26 – 2014-08-27 (×2): 2 via ORAL
  Filled 2014-08-26 (×2): qty 2

## 2014-08-26 MED ORDER — PRENATAL MULTIVITAMIN CH
1.0000 | ORAL_TABLET | Freq: Every day | ORAL | Status: DC
  Administered 2014-08-27 – 2014-08-28 (×2): 1 via ORAL
  Filled 2014-08-26 (×2): qty 1

## 2014-08-26 MED ORDER — FENTANYL 2.5 MCG/ML BUPIVACAINE 1/10 % EPIDURAL INFUSION (WH - ANES)
14.0000 mL/h | INTRAMUSCULAR | Status: DC | PRN
  Administered 2014-08-26: 14 mL/h via EPIDURAL
  Filled 2014-08-26: qty 125

## 2014-08-26 MED ORDER — IBUPROFEN 600 MG PO TABS
600.0000 mg | ORAL_TABLET | Freq: Four times a day (QID) | ORAL | Status: DC
  Administered 2014-08-26 – 2014-08-28 (×8): 600 mg via ORAL
  Filled 2014-08-26 (×9): qty 1

## 2014-08-26 MED ORDER — CITRIC ACID-SODIUM CITRATE 334-500 MG/5ML PO SOLN: 30.0000 mL | ORAL | Status: DC | PRN

## 2014-08-26 MED ORDER — TETANUS-DIPHTH-ACELL PERTUSSIS 5-2.5-18.5 LF-MCG/0.5 IM SUSP
0.5000 mL | Freq: Once | INTRAMUSCULAR | Status: DC
Start: 2014-08-27 — End: 2014-08-28

## 2014-08-26 MED ORDER — ACETAMINOPHEN 325 MG PO TABS: 650.0000 mg | ORAL_TABLET | ORAL | Status: DC | PRN

## 2014-08-26 MED ORDER — FENTANYL 2.5 MCG/ML BUPIVACAINE 1/10 % EPIDURAL INFUSION (WH - ANES): 14.0000 mL/h | INTRAMUSCULAR | Status: DC | PRN

## 2014-08-26 MED ORDER — ONDANSETRON HCL 4 MG PO TABS: 4.0000 mg | ORAL_TABLET | ORAL | Status: DC | PRN

## 2014-08-26 MED ORDER — SIMETHICONE 80 MG PO CHEW
80.0000 mg | CHEWABLE_TABLET | ORAL | Status: DC | PRN
Start: 2014-08-26 — End: 2014-08-28

## 2014-08-26 MED ORDER — OXYTOCIN BOLUS FROM INFUSION: 500.0000 mL | INTRAVENOUS | Status: DC

## 2014-08-26 MED ORDER — ONDANSETRON HCL 4 MG/2ML IJ SOLN: 4.0000 mg | INTRAMUSCULAR | Status: DC | PRN

## 2014-08-26 MED ORDER — LACTATED RINGERS IV SOLN
INTRAVENOUS | Status: DC
  Administered 2014-08-26 (×2): via INTRAVENOUS

## 2014-08-26 MED ORDER — LANOLIN HYDROUS EX OINT: TOPICAL_OINTMENT | CUTANEOUS | Status: DC | PRN

## 2014-08-26 MED ORDER — OXYCODONE-ACETAMINOPHEN 5-325 MG PO TABS: 2.0000 | ORAL_TABLET | ORAL | Status: DC | PRN

## 2014-08-26 MED ORDER — WITCH HAZEL-GLYCERIN EX PADS: 1.0000 "application " | MEDICATED_PAD | CUTANEOUS | Status: DC | PRN

## 2014-08-26 MED ORDER — DIPHENHYDRAMINE HCL 25 MG PO CAPS: 25.0000 mg | ORAL_CAPSULE | Freq: Four times a day (QID) | ORAL | Status: DC | PRN

## 2014-08-26 MED ORDER — PHENYLEPHRINE 40 MCG/ML (10ML) SYRINGE FOR IV PUSH (FOR BLOOD PRESSURE SUPPORT)
80.0000 ug | PREFILLED_SYRINGE | INTRAVENOUS | Status: DC | PRN
  Filled 2014-08-26: qty 2

## 2014-08-26 NOTE — MAU Note (Signed)
V Standard in to examine patient. Patient to be admitted to L & D.

## 2014-08-26 NOTE — MAU Provider Note (Signed)
Susan Fleming is a 29 y.o. G2P1001 at 39.5 weeks presents to MAU c/o ctx.  She denies vb or lof w/+FM.  She report at the last OB visit her cervix was 3cm   History     Patient Active Problem List   Diagnosis Date Noted  . Hepatitis B immune 12/17/2013    Chief Complaint  Patient presents with  . Labor Eval   HPI  OB History    Gravida Para Term Preterm AB TAB SAB Ectopic Multiple Living   2 1 1       1       Past Medical History  Diagnosis Date  . Medical history non-contributory     Past Surgical History  Procedure Laterality Date  . No past surgeries      History reviewed. No pertinent family history.  History  Substance Use Topics  . Smoking status: Never Smoker   . Smokeless tobacco: Never Used  . Alcohol Use: No    Allergies: No Known Allergies  Prescriptions prior to admission  Medication Sig Dispense Refill Last Dose  . acetaminophen (TYLENOL) 325 MG tablet Take 650 mg by mouth every 6 (six) hours as needed.   01/14/2014 at Unknown time  . metroNIDAZOLE (FLAGYL) 500 MG tablet Take 1 tablet (500 mg total) by mouth 2 (two) times daily. 14 tablet 0   . Prenatal Vit-Fe Fumarate-FA (PRENATAL MULTIVITAMIN) TABS tablet Take 1 tablet by mouth daily at 12 noon.   01/15/2014 at Unknown time    ROS See HPI above, all other systems are negative  Physical Exam   Blood pressure 153/75, pulse 104, temperature 99.2 F (37.3 C), temperature source Oral, resp. rate 18, height 5\' 6"  (1.676 m), weight 207 lb (93.895 kg), last menstrual period 11/21/2013, SpO2 100 %.  Physical Exam Ext:  WNL ABD: Soft, non tender to palpation, no rebound or guarding SVE: 3-4/60/-3, posterior   ED Course  Assessment: IUP at  39.5weeks Membranes: intact FHR: 140 CTX:  2-4 minutes   Plan: Pt given 3 options.  1- NST, stay for 1 hour and be recheck for change. 2 - NST then walk for an hour and be recheck or 3 - NST and go home and come back later.  Pt elected to stay for 1 hour and  be rechecked    Randalyn Ahmed, CNM, MSN 08/26/2014. 4:37 AM

## 2014-08-26 NOTE — H&P (Signed)
Susan Fleming is a 29 y.o. female, G2 P1001 at 39.5 weeks  Patient Active Problem List   Diagnosis Date Noted  . Hepatitis B immune 12/17/2013    Pregnancy Course: Patient entered care at 14.2 weeks.   EDC of 08-28-14 was established by US.      US evaluations:   7.6 weeks - Dating: S=D, fhr 178, yolk and embryo seen  18.5 weeks - Anatomy:S=D, EFW 9oz - 56.1%, cervical length 4.61, breech, posteriorplacenta 2.3 from the os,                                     female, fhr 143,       Significant prenatal events:   Late to care.   HbSag positive on 12/08/13--- HbSAg negative 12/2011 and 07/2012--[facility] at [facility] feels due to hx of previous immunization.  Per Infectious Disease MD, Dr. Comer--hepatitis B tests confirm that she does not have active nor old hepatitis B infection and her initial test represents immunity from previous vaccine only, as expected.  No f/u needed.   Last evaluation:   39.3 weeks   VE:3/50/-1 on 08/24/14  Reason for admission:  labor  Pt States:   Contractions Frequency: 4-5         Contraction severity: strong         Fetal activity: +FM  OB History    Gravida Para Term Preterm AB TAB SAB Ectopic Multiple Living   2 1 1       1      Past Medical History  Diagnosis Date  . Medical history non-contributory    Past Surgical History  Procedure Laterality Date  . No past surgeries     Family History: family history is not on file. Social History:  reports that she has never smoked. She has never used smokeless tobacco. She reports that she does not drink alcohol or use illicit drugs.   Prenatal Transfer Tool  Maternal Diabetes: No Genetic Screening: Normal Maternal Ultrasounds/Referrals: Normal Fetal Ultrasounds or other Referrals:  None Maternal Substance Abuse:  No Significant Maternal Medications:  None Significant Maternal Lab Results: Lab values include: HBsAG positive   ROS:  See HPI above, all other systems are negative  No Known Allergies  Dilation:  3.5 Effacement (%): 70 Station: -3 Exam by:: V Jarmar Rousseau CNM Blood pressure 153/75, pulse 104, temperature 99.2 F (37.3 C), temperature source Oral, resp. rate 18, height 5\' 6"  (1.676 m), weight 207 lb (93.895 kg), last menstrual period 11/21/2013, SpO2 100 %.  Maternal Exam:  Uterine Assessment: Contraction frequency is rare.  Abdomen: Gravid, non tender. Fundal height is aga.  Normal external genitalia, vulva, cervix, uterus and adnexa.  No lesions noted on exam.  Pelvis adequate for delivery.  Fetal presentation: Vertex by Leopold's   Fetal Exam:  Monitor Surveillance : Continuous Monitoring  Mode: Ultrasound.  NICHD: Category CTXs: Q 4-636minutes EFW   7.5 lbs  Physical Exam: Nursing note and vitals reviewed General: alert and cooperative She appears well nourished Psychiatric: Normal mood and affect. Her behavior is normal Head: Normocephalic Eyes: Pupils are equal, round, and reactive to light Neck: Normal range of motion Cardiovascular: RRR without murmur  Respiratory: CTAB. Effort normal  Abd: soft, non-tender, +BS, no rebound, no guarding  Genitourinary: Vagina normal  Neurological: A&Ox3 Skin: Warm and dry  Musculoskeletal: Normal range of motion  Homan's sign negative bilaterally No evidence of  DVTs.  Edema: Minimal bilaterally non-pitting edema DTR: 2+ Clonus: None   Prenatal labs: ABO, Rh:  O positive Antibody:   Rubella:   immune RPR:   NR HBsAg: NEGATIVE (04/30 1610) see note above HIV:   negative GBS:  negative Sickle cell/Hgb electrophoresis:  WNL Pap:  wnl 12/09/14 GC:   negative Chlamydia: negative Genetic screenings:   Glucola:    Assessment:  IUP at 39.5 weeks NICHD: Category Membranes: BBW GBS negative  Plan:  Admit to L&D for expectant/active management of labor. Possible augmentation options reviewed including foley bulb, AROM and/or pitocin.   GBS prophylaxis with PCN G per Marjie Chea dosing with onset of active labor.  IV  pain medication per orders PRN Epidural per patient request Foley cath after patient is comfortable with epidural Anticipate SVD  Labor mgmt as ordered  May auscultate FHR intermittently,  if expectant management     q 30 min in active labor     q 15 min in transition     q 5 min with pushing.     May ambulate without monitoring.     If no active labor, may do NST q 2 hours.   Attending MD available at all times.  Bryson Gavia, CNM, MSN 08/26/2014, 6:28 AM       All information will be confirmed upon admisson

## 2014-08-26 NOTE — Anesthesia Procedure Notes (Signed)

## 2014-08-26 NOTE — Anesthesia Preprocedure Evaluation (Signed)

## 2014-08-26 NOTE — Anesthesia Postprocedure Evaluation (Signed)
  Anesthesia Post-op Note  Patient: Susan Fleming  Procedure(s) Performed: * No procedures listed *  Patient Location: Mother/Baby  Anesthesia Type:Epidural  Level of Consciousness: awake  Airway and Oxygen Therapy: Patient Spontanous Breathing  Post-op Pain: mild  Post-op Assessment: Patient's Cardiovascular Status Stable and Respiratory Function Stable  Post-op Vital Signs: stable  Last Vitals:  Filed Vitals:   08/26/14 1240  BP: 125/75  Pulse: 96  Temp: 37.2 C  Resp: 20    Complications: No apparent anesthesia complications

## 2014-08-26 NOTE — MAU Provider Note (Signed)
MAU Addendum Note  FHR 130, moderate variability, + accel, no decel since 0440 ctx q 4-5.  Early decel noted at 0446 VE 4-5/80/-2   Plan: Admit to L&D for expectant management  Malissia Rabbani, CNM, MSN 08/26/2014. 6:17 AM

## 2014-08-26 NOTE — Progress Notes (Signed)
Susan Fleming is a 29 y.o. G2P1001 at 6625w5d  Subjective: Feels pressure in bottom.  Still has not had any leaking.  + bloody show now.  Objective: BP 109/70 mmHg  Pulse 115  Temp(Src) 98.1 F (36.7 C) (Oral)  Resp 18  Ht 5\' 6"  (1.676 m)  Wt 93.895 kg (207 lb)  BMI 33.43 kg/m2  SpO2 100%  LMP 11/21/2013      FHT:  FHR: 120s bpm, variability: moderate,  accelerations:  Present,  decelerations:  Absent UC:   regular, every 2-3 minutes SVE at 9:20am:   Dilation: Lip/rim Effacement (%): 100 Station: +1 Exam by:: Su Hiltoberts AROM clear fluid  Labs: Lab Results  Component Value Date   WBC 18.6* 08/26/2014   HGB 13.8 08/26/2014   HCT 38.3 08/26/2014   MCV 88.9 08/26/2014   PLT 133* 08/26/2014    Assessment / Plan: Spontaneous labor, progressing normally  Labor: Progressing normally Preeclampsia:  n/a Fetal Wellbeing:  Category I Pain Control:  Epidural I/D:  GBS neg Anticipated MOD:  NSVD  Susan Fleming Y 08/26/2014, 9:46 AM  Shortlyb after AROM pt started having decel to the 90s.  I encouraged her to push to see if lip could be reduced and facilitate faster delivery.  Pt became visibly upset and was tearful and her family felt it was because the FOB couldn't be there.  I discussed resting and allowing pt to labor down and she wanted to do that.  Pt started to have variable decels after AROM.

## 2014-08-27 LAB — CBC
HCT: 31 % — ABNORMAL LOW (ref 36.0–46.0)
Hemoglobin: 10.4 g/dL — ABNORMAL LOW (ref 12.0–15.0)
MCH: 30.1 pg (ref 26.0–34.0)
MCHC: 33.5 g/dL (ref 30.0–36.0)
MCV: 89.9 fL (ref 78.0–100.0)
PLATELETS: 145 10*3/uL — AB (ref 150–400)
RBC: 3.45 MIL/uL — AB (ref 3.87–5.11)
RDW: 15.2 % (ref 11.5–15.5)
WBC: 15.8 10*3/uL — AB (ref 4.0–10.5)

## 2014-08-27 LAB — HIV ANTIBODY (ROUTINE TESTING W REFLEX): HIV-1/HIV-2 Ab: NONREACTIVE

## 2014-08-27 NOTE — Lactation Note (Signed)
This note was copied from the chart of Boy Yoanna Sweatman. Lactation Consultation Note  Patient Name: Boy Reinaldo MeekerMia Blackshire XBMWU'XToday's Date: 08/27/2014 Reason for consult: Initial assessment Baby 29 hours of life. Asked to assist patient as baby has not voided in past 29 hours. Mom states that she is able to hand express colostrum, hears baby swallow at the breast, and baby is having transitional stools. Mom reports that baby just nursed within the hour and baby is asleep in mom's arms. Enc mom to nurse with cues and at least 8-12 times/24 hours. Enc mom to call out for assistance with latch at next feeding.   [facility], aware of OP/BFSG, community resources, and [facility] assistance after D/C.   Maternal Data Has patient been taught Hand Expression?: Yes (Per patient.) Does the patient have breastfeeding experience prior to this delivery?: No  Feeding Feeding Type:  (Patient states baby nursed within last hour.) Nipple Type: Slow - flow Length of feed: 10 min  LATCH Score/Interventions                      Lactation Tools Discussed/Used     Consult Status Consult Status: Follow-up Date: 08/27/14 Follow-up type: In-patient    Geralynn OchsWILLIARD, Caylan Chenard 08/27/2014, 3:42 PM

## 2014-08-27 NOTE — Progress Notes (Addendum)
Subjective: Postpartum Day 1: Vaginal delivery, vacuum [facility], periurethral laceration Patient up ad lib, reports no syncope or dizziness. Feeding:  Bresat Contraceptive plan:  Undecided  Circumcision deferred today due to no void yet.  Objective: Vital signs in last 24 hours: Temp:  [97.8 F (36.6 C)-99 F (37.2 C)] 98.6 F (37 C) (01/08 0556) Pulse Rate:  [83-106] 83 (01/08 0556) Resp:  [18-20] 18 (01/08 0556) BP: (97-131)/(56-75) 126/58 mmHg (01/08 0556) SpO2:  [100 %] 100 % (01/07 1838)  Physical Exam:  General: alert Lochia: appropriate Uterine Fundus: firm Perineum: healing well DVT Evaluation: No evidence of DVT seen on physical exam. Negative Homan's sign.    Recent Labs  08/26/14 0640 08/27/14 0617  HGB 13.8 10.4*  HCT 38.3 31.0*    Assessment/Plan: Status post vaginal delivery day 1. Stable Continue current care. Plan for discharge tomorrow  Contraceptive options discussed.    Nyra CapesLATHAM, VICKICNM 08/27/2014, 11:18 AM  I saw and examined patient at bedside and agree with above findings, assessment and plan as per [facility].  Dr. Sallye OberKulwa.

## 2014-08-27 NOTE — Lactation Note (Signed)
This note was copied from the chart of Boy Ricquel Primeau. Lactation Consultation Note Mom called out several times for BF assistance. Mom states nipples are sore, baby is latching and feeding, but she doesn't feel like he is getting anything, hasn't voided in over 30 hours. Supplemented to see if can void. Noted mom to have short shaft nipples, areolas doesn't compress for a deep latch. Fitted w/#20 NS, taught application. Baby latched well. Needed multiple attempts d/t popping off and on. Suck vigorously, then would cry, suck again.noted a small amount of moisture in NS. Breast feels slightly heavy, unable to express any colostrum. States breast has changed some, not leaking any. Has positional stripes to both nipples. Demonstrated football hold, discussed positioning, cheek to breast. Gave comfort gels, and shells to wear to assist in everting nipples for a deeper latch. Mom wearing bra, put shell on Lt. Nipple while BF on Rt. Encouraged not to sleep in them. Hand pump given to stimulate breast. Encouraged to monitor for swallows and I&O, look for colostrum in NS. Patient Name: Boy Reinaldo MeekerMia Deal WUJWJ'XToday's Date: 08/27/2014 Reason for consult: Follow-up assessment;Breast/nipple pain;Difficult latch   Maternal Data    Feeding Feeding Type: Breast Fed Length of feed: 20 min  LATCH Score/Interventions Latch: Grasps breast easily, tongue down, lips flanged, rhythmical sucking. Intervention(s): Adjust position;Assist with latch;Breast massage;Breast compression  Audible Swallowing: A few with stimulation Intervention(s): Hand expression;Skin to skin  Type of Nipple: Everted at rest and after stimulation (short shaft)  Comfort (Breast/Nipple): Filling, red/small blisters or bruises, mild/mod discomfort  Problem noted: Mild/Moderate discomfort Interventions (Mild/moderate discomfort): Comfort gels;Breast shields;Pre-pump if needed;Hand expression;Hand massage  Hold (Positioning): Assistance needed to  correctly position infant at breast and maintain latch. Intervention(s): Breastfeeding basics reviewed;Support Pillows;Position options;Skin to skin  LATCH Score: 7  Lactation Tools Discussed/Used Tools: Shells;Nipple Shields;Pump;Comfort gels Nipple shield size: 20;24 Shell Type: Inverted Breast pump type: Manual Pump Review: Setup, frequency, and cleaning;Milk Storage Initiated by:: Peri JeffersonL. Sinjin Amero RN Date initiated:: 08/27/14   Consult Status Consult Status: Follow-up Date: 08/28/13 Follow-up type: In-patient    Charyl DancerCARVER, Shakirra Buehler G 08/27/2014, 8:07 PM

## 2014-08-28 MED ORDER — IBUPROFEN 600 MG PO TABS: 600.0000 mg | ORAL_TABLET | Freq: Four times a day (QID) | ORAL | Status: DC | PRN

## 2014-08-28 MED ORDER — NORETHINDRONE 0.35 MG PO TABS: 1.0000 | ORAL_TABLET | Freq: Every day | ORAL | Status: DC

## 2014-08-28 NOTE — Discharge Instructions (Signed)

## 2014-08-28 NOTE — Lactation Note (Signed)
This note was copied from the chart of Susan Iantha Kevorkian. Lactation Consultation Note: Called to assist mom with latch. Discussed supplementing while at the breast with a feeding tube and syringe. Mom agreeable,. Baby latched and nursed for 15 min with feeding tube/ syringe. Reviewed setup and cleaning with mom. Changing diaper as I left room. No questions at present. To call for assist prn  Patient Name: Susan Fleming MeekerMia Schmaltz NWGNF'AToday's Date: 08/28/2014 Reason for consult: Follow-up assessment   Maternal Data    Feeding Feeding Type: Breast Fed Nipple Type: Slow - flow Length of feed: 15 min  LATCH Score/Interventions Latch: Grasps breast easily, tongue down, lips flanged, rhythmical sucking.  Audible Swallowing: A few with stimulation  Type of Nipple: Everted at rest and after stimulation  Comfort (Breast/Nipple): Soft / non-tender     Hold (Positioning): Assistance needed to correctly position infant at breast and maintain latch. Intervention(s): Breastfeeding basics reviewed  LATCH Score: 8  Lactation Tools Discussed/Used Tools: 60F feeding tube / Syringe WIC Program: No Initiated by:: DW Date initiated:: 08/28/14   Consult Status Consult Status: Follow-up Date: 08/29/14 Follow-up type: In-patient    Pamelia HoitWeeks, Orena Cavazos D 08/28/2014, 2:32 PM

## 2014-08-28 NOTE — Discharge Summary (Signed)
  Vaginal Delivery Discharge Summary  Susan Fleming  DOB:    11/30/1985 MRN:    [id number] CSN:    [id number]  Date of admission:                  08/26/14  Date of discharge:                   08/28/14  Procedures this admission:   Vacuum extraction, repair of periurethral laceration  Date of Delivery: 08/26/14  Newborn Data:  Live born female  Birth Weight: 7 lb 3.7 oz (3280 g) APGAR: 9, 9  Home with mother. Circumcision Plan: Inpatient  History of Present Illness:  [name] is a 29 y.o. female, G2P2002, who presents at 5222w5d weeks gestation. [facility] She was admitted onset of labor. Her pregnancy has been complicated by:  Patient Active Problem List   Diagnosis Date Noted  . Vaginal delivery 08/28/2014     Hospital Course:  Admitted 08/26/14 in early labor. Negative GBS. Progressed spontaneously. Utilized epidural for pain management.  Vacuum assisted vaginal delivery was performed by Osborn CohoAngela Roberts, MD, due to deceleration/bradycardia,  without complication. Patient and baby tolerated the procedure without difficulty, with  periurethral laceration noted. Infant status was stable and remained in room with mother.  Mother and infant then had an uncomplicated postpartum course, with breastfeeding going well. Mom's physical exam was WNL, and she was discharged home in stable condition. Contraception plan was Micronor.  She received adequate benefit from po pain medications, using Motrin only.   Feeding:  breast  Contraception:  oral progesterone-only contraceptive  Discharge hemoglobin:  HEMOGLOBIN  Date Value Ref Range Status  08/27/2014 10.4* 12.0 - 15.0 g/dL Final    Comment:    DELTA CHECK NOTED REPEATED TO VERIFY    HCT  Date Value Ref Range Status  08/27/2014 31.0* 36.0 - 46.0 % Final    Discharge Physical Exam:   General: alert Lochia:  appropriate Uterine Fundus: firm Incision: healing well DVT Evaluation: No evidence of DVT seen on physical exam. Negative Homan's sign.  Intrapartum Procedures: vacuum assisted vaginal delivery due to FHR decel/bradycardia in 2nd stage. Postpartum Procedures: none Complications-Operative and Postpartum: none  Discharge Diagnoses: Term Pregnancy-delivered  Discharge Information:  Activity:           pelvic rest Diet:                routine Medications: Ibuprofen Condition:      stable Instructions:     Discharge to: home  Follow-up Information    [facility] weeks.   Specialty:  Obstetrics and Gynecology   Why:  Call for any questions or concerns.   Contact information:   [address] [place] [phone]       Nigel BridgemanLATHAM, Keyshaun Exley CNM 08/28/2014 9:07 AM

## 2014-10-08 IMAGING — US US OB TRANSVAGINAL
1 series · 14 of 28 positions shown · non-contrast
Comparison: None.

CLINICAL DATA: 28-year-old female with vaginal bleeding. Estimated
gestational age by LMP 7 weeks 6 days. Initial encounter.

EXAM:
OBSTETRIC <14 WK US AND TRANSVAGINAL OB US
TECHNIQUE: Both transabdominal and transvaginal ultrasound examinations were
performed for complete evaluation of the gestation as well as the
maternal uterus, adnexal regions, and pelvic cul-de-sac.
Transvaginal technique was performed to assess early pregnancy.

[Series 1: us ob comp less 14 wks · 14 of 49 slices shown]
[im 2/49]
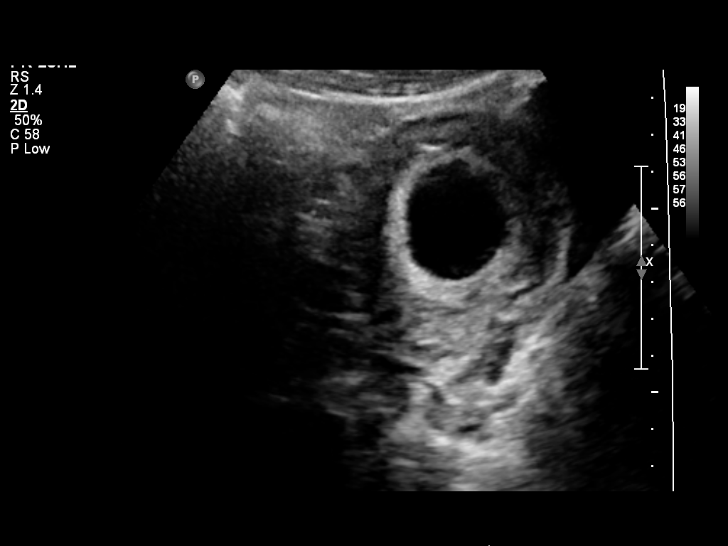
[im 6/49]
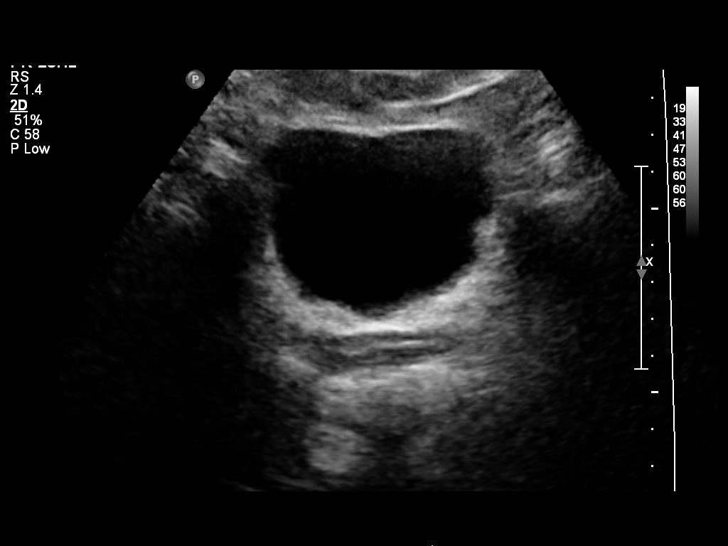
[im 9/49]
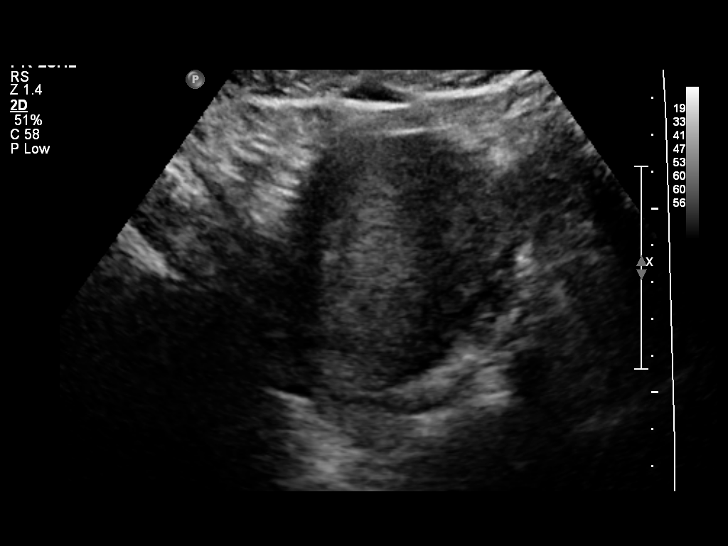
[im 13/49]
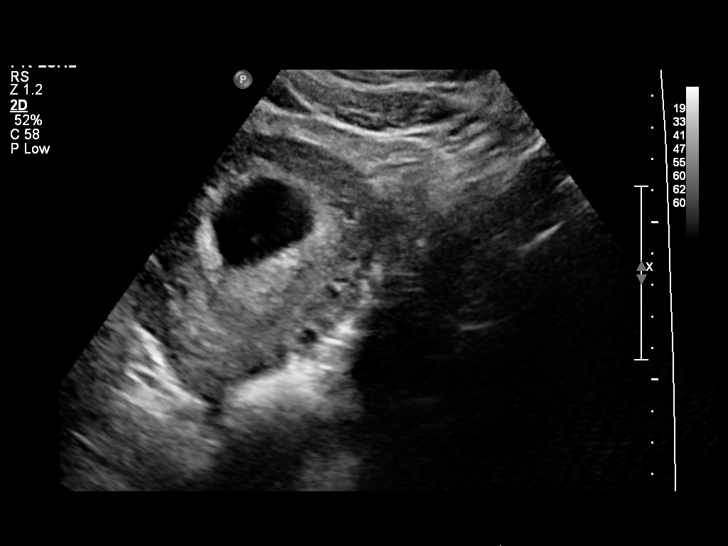
[im 17/49]
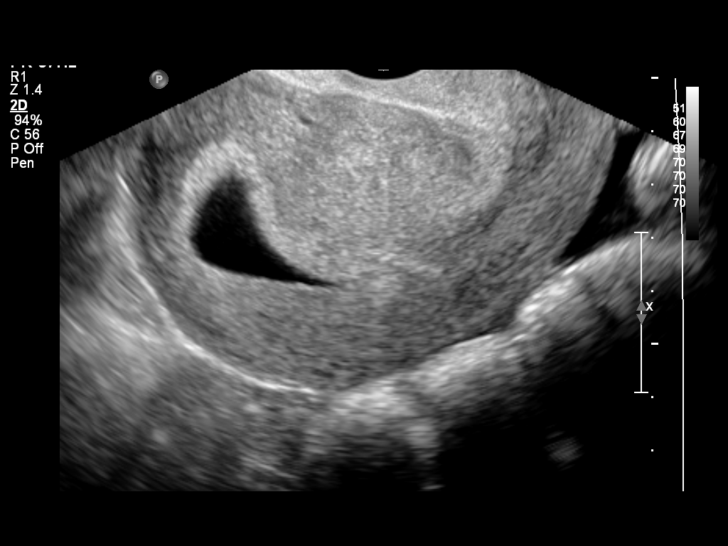
[im 20/49]
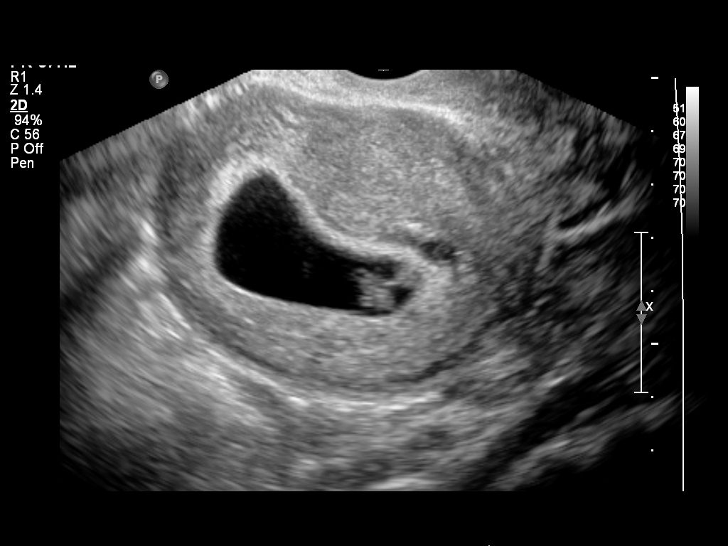
[im 24/49]
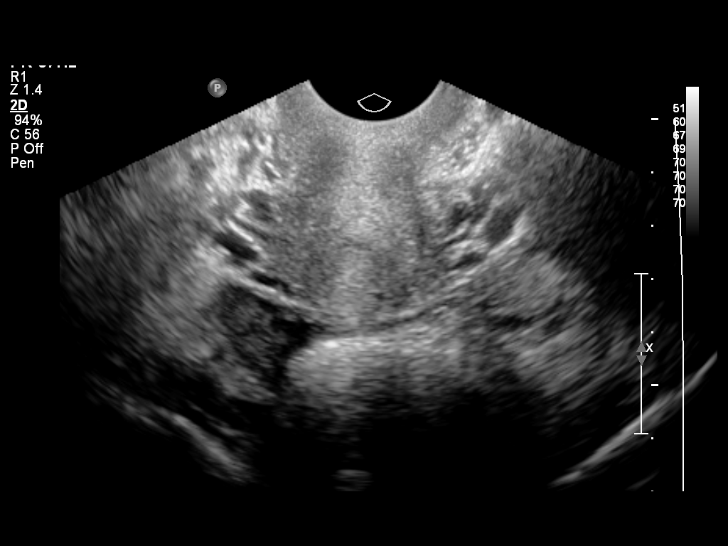
[im 27/49]
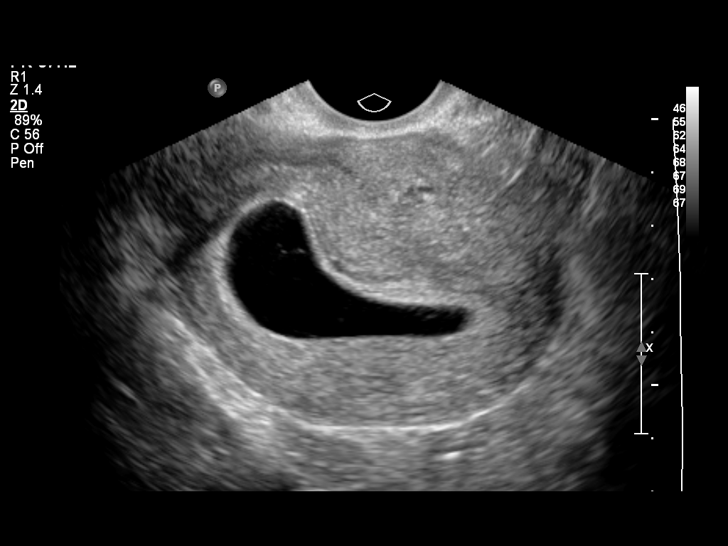
[im 31/49]
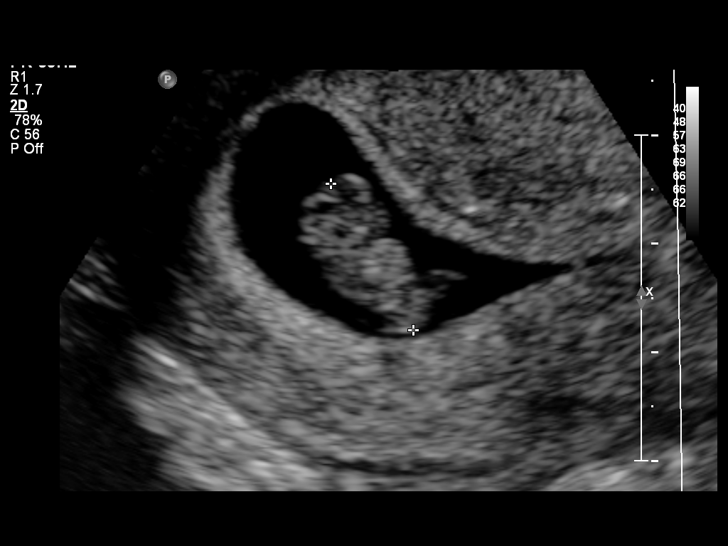
[im 34/49]
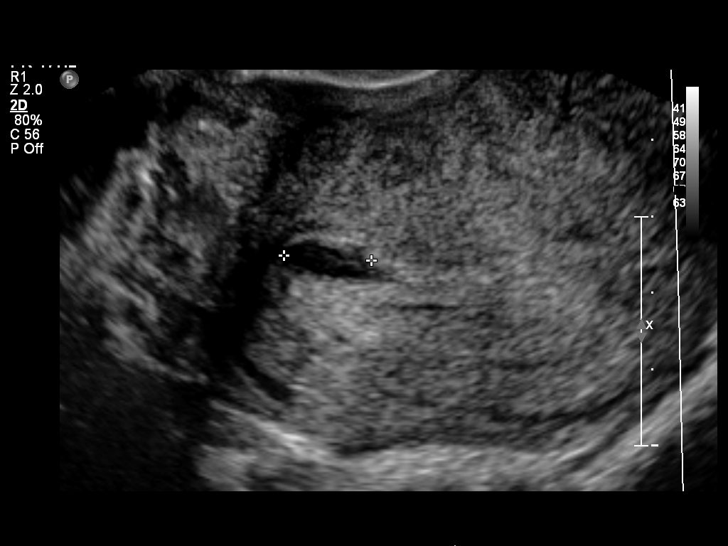
[im 38/49]
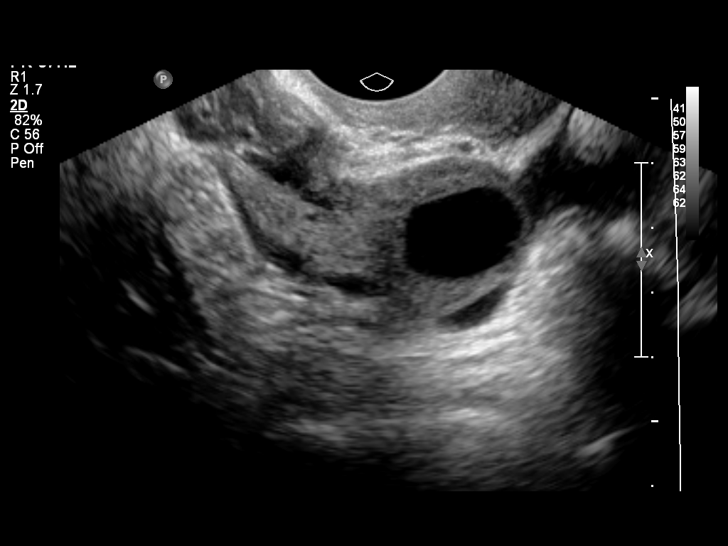
[im 41/49]
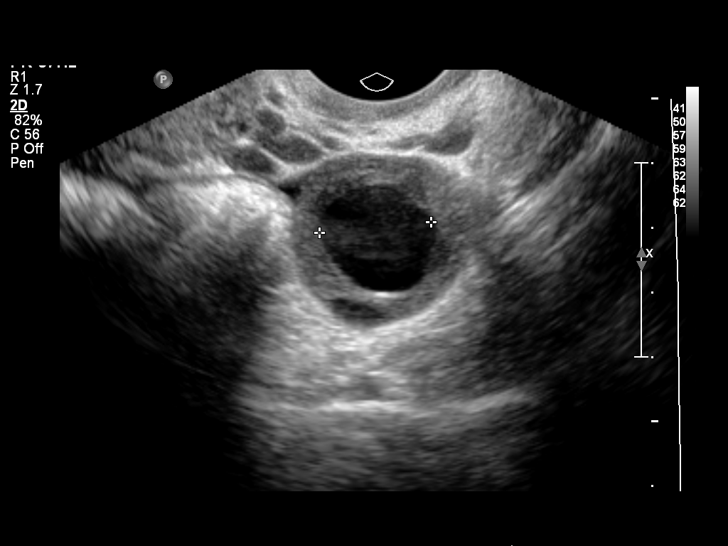
[im 45/49]
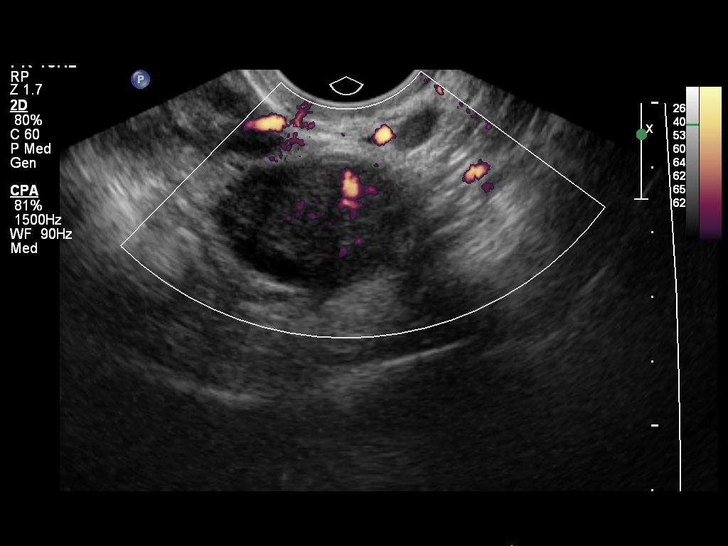
[im 49/49]
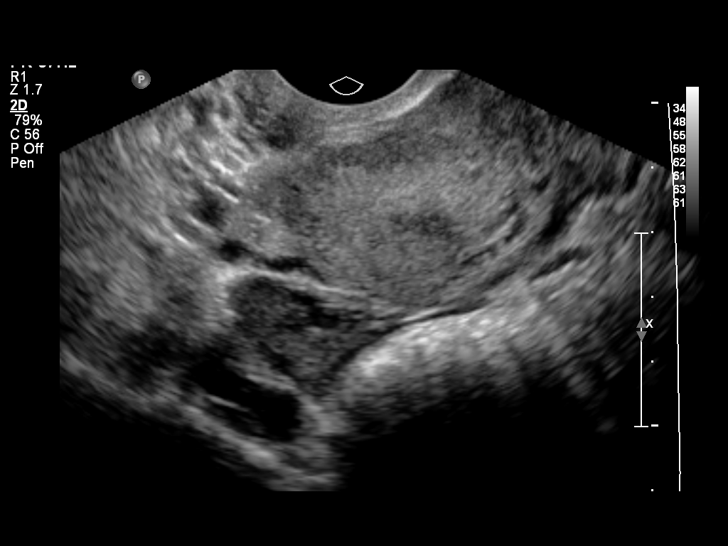

[14 of 28 positions shown; findings below may reference images not displayed]

FINDINGS: Intrauterine gestational sac: Single

Yolk sac:  Visible

Embryo:  Visible

Cardiac Activity: Detected

Heart Rate:  178 bpm

CRL:   1.59 cm  mm   8 w 0 d

Maternal uterus/adnexae: Small volume subchorionic hemorrhage. See
image 35 of transvaginal images. Probable corpus luteum cyst in the
left ovary which otherwise appears normal. Normal right ovary. Small
volume pelvic free fluid.
IMPRESSION: 1. Single living IUP demonstrated. No acute maternal findings
visualized.
2. Small volume subchorionic hemorrhage and pelvic free fluid.

## 2018-07-16 ENCOUNTER — Other Ambulatory Visit: Payer: Self-pay | Admitting: Obstetrics and Gynecology

## 2018-08-29 ENCOUNTER — Other Ambulatory Visit: Payer: Self-pay

## 2018-08-29 ENCOUNTER — Ambulatory Visit (HOSPITAL_COMMUNITY)
Admission: RE | Admit: 2018-08-29 | Discharge: 2018-08-29 | Disposition: A | Discharge status: Home / Self Care | Payer: BLUE CROSS/BLUE SHIELD | Attending: Obstetrics and Gynecology | Admitting: Obstetrics and Gynecology

## 2018-08-29 ENCOUNTER — Ambulatory Visit (HOSPITAL_COMMUNITY): Payer: BLUE CROSS/BLUE SHIELD | Admitting: Anesthesiology

## 2018-08-29 ENCOUNTER — Encounter (HOSPITAL_COMMUNITY)
Admission: RE | Disposition: A | Discharge status: Home / Self Care | Payer: Self-pay | Source: Home / Self Care | Attending: Obstetrics and Gynecology

## 2018-08-29 ENCOUNTER — Encounter (HOSPITAL_COMMUNITY): Payer: Self-pay

## 2018-08-29 DIAGNOSIS — Z302 Encounter for sterilization: Secondary | ICD-10-CM | POA: Insufficient documentation

## 2018-08-29 DIAGNOSIS — Z793 Long term (current) use of hormonal contraceptives: Secondary | ICD-10-CM | POA: Insufficient documentation

## 2018-08-29 HISTORY — PX: LAPAROSCOPIC BILATERAL SALPINGECTOMY: SHX5889

## 2018-08-29 LAB — CBC
HCT: 46.6 % — ABNORMAL HIGH (ref 36.0–46.0)
Hemoglobin: 15.6 g/dL — ABNORMAL HIGH (ref 12.0–15.0)
MCH: 30.7 pg (ref 26.0–34.0)
MCHC: 33.5 g/dL (ref 30.0–36.0)
MCV: 91.7 fL (ref 80.0–100.0)
NRBC: 0 % (ref 0.0–0.2)
Platelets: 270 10*3/uL (ref 150–400)
RBC: 5.08 MIL/uL (ref 3.87–5.11)
RDW: 13 % (ref 11.5–15.5)
WBC: 9.3 10*3/uL (ref 4.0–10.5)

## 2018-08-29 LAB — PREGNANCY, URINE: Preg Test, Ur: NEGATIVE

## 2018-08-29 SURGERY — SALPINGECTOMY, BILATERAL, LAPAROSCOPIC
Anesthesia: General | Laterality: Bilateral

## 2018-08-29 MED ORDER — ONDANSETRON HCL 4 MG/2ML IJ SOLN
INTRAMUSCULAR | Status: DC | PRN
  Administered 2018-08-29: 4 mg via INTRAVENOUS

## 2018-08-29 MED ORDER — MIDAZOLAM HCL 2 MG/2ML IJ SOLN
INTRAMUSCULAR | Status: AC
  Filled 2018-08-29: qty 2

## 2018-08-29 MED ORDER — ROCURONIUM BROMIDE 100 MG/10ML IV SOLN
INTRAVENOUS | Status: AC
  Filled 2018-08-29: qty 1

## 2018-08-29 MED ORDER — ACETAMINOPHEN 160 MG/5ML PO SOLN: 325.0000 mg | ORAL | Status: DC | PRN

## 2018-08-29 MED ORDER — SUGAMMADEX SODIUM 200 MG/2ML IV SOLN
INTRAVENOUS | Status: DC | PRN
  Administered 2018-08-29: 150 mg via INTRAVENOUS

## 2018-08-29 MED ORDER — OXYCODONE HCL 5 MG PO TABS: 5.0000 mg | ORAL_TABLET | Freq: Once | ORAL | Status: DC | PRN

## 2018-08-29 MED ORDER — OXYCODONE-ACETAMINOPHEN 7.5-325 MG PO TABS: 1.0000 | ORAL_TABLET | Freq: Four times a day (QID) | ORAL | 0 refills | Status: AC | PRN

## 2018-08-29 MED ORDER — PROPOFOL 10 MG/ML IV BOLUS
INTRAVENOUS | Status: AC
  Filled 2018-08-29: qty 20

## 2018-08-29 MED ORDER — IBUPROFEN 600 MG PO TABS: 600.0000 mg | ORAL_TABLET | Freq: Four times a day (QID) | ORAL | 0 refills | Status: AC | PRN

## 2018-08-29 MED ORDER — ONDANSETRON HCL 4 MG/2ML IJ SOLN: 4.0000 mg | Freq: Once | INTRAMUSCULAR | Status: DC | PRN

## 2018-08-29 MED ORDER — KETOROLAC TROMETHAMINE 30 MG/ML IJ SOLN
INTRAMUSCULAR | Status: DC | PRN
  Administered 2018-08-29: 30 mg via INTRAVENOUS

## 2018-08-29 MED ORDER — SCOPOLAMINE 1 MG/3DAYS TD PT72
1.0000 | MEDICATED_PATCH | Freq: Once | TRANSDERMAL | Status: DC
  Administered 2018-08-29: 1.5 mg via TRANSDERMAL

## 2018-08-29 MED ORDER — SCOPOLAMINE 1 MG/3DAYS TD PT72
MEDICATED_PATCH | TRANSDERMAL | Status: AC
  Administered 2018-08-29: 1.5 mg via TRANSDERMAL
  Filled 2018-08-29: qty 1

## 2018-08-29 MED ORDER — OXYCODONE HCL 5 MG/5ML PO SOLN: 5.0000 mg | Freq: Once | ORAL | Status: DC | PRN

## 2018-08-29 MED ORDER — ACETAMINOPHEN 325 MG PO TABS: 325.0000 mg | ORAL_TABLET | ORAL | Status: DC | PRN

## 2018-08-29 MED ORDER — METOCLOPRAMIDE HCL 5 MG/ML IJ SOLN
INTRAMUSCULAR | Status: DC | PRN
  Administered 2018-08-29: 10 mg via INTRAVENOUS

## 2018-08-29 MED ORDER — BUPIVACAINE-EPINEPHRINE (PF) 0.25% -1:200000 IJ SOLN
INTRAMUSCULAR | Status: AC
  Filled 2018-08-29: qty 30

## 2018-08-29 MED ORDER — LIDOCAINE HCL (CARDIAC) PF 100 MG/5ML IV SOSY
PREFILLED_SYRINGE | INTRAVENOUS | Status: AC
  Filled 2018-08-29: qty 5

## 2018-08-29 MED ORDER — PROPOFOL 10 MG/ML IV BOLUS
INTRAVENOUS | Status: DC | PRN
  Administered 2018-08-29: 150 mg via INTRAVENOUS

## 2018-08-29 MED ORDER — FENTANYL CITRATE (PF) 100 MCG/2ML IJ SOLN: 25.0000 ug | INTRAMUSCULAR | Status: DC | PRN

## 2018-08-29 MED ORDER — LACTATED RINGERS IV SOLN
INTRAVENOUS | Status: DC
  Administered 2018-08-29 (×2): via INTRAVENOUS

## 2018-08-29 MED ORDER — ONDANSETRON HCL 4 MG/2ML IJ SOLN
INTRAMUSCULAR | Status: AC
  Filled 2018-08-29: qty 2

## 2018-08-29 MED ORDER — FENTANYL CITRATE (PF) 100 MCG/2ML IJ SOLN
INTRAMUSCULAR | Status: DC | PRN
  Administered 2018-08-29: 50 ug via INTRAVENOUS
  Administered 2018-08-29: 100 ug via INTRAVENOUS
  Administered 2018-08-29 (×2): 50 ug via INTRAVENOUS

## 2018-08-29 MED ORDER — BUPIVACAINE-EPINEPHRINE 0.25% -1:200000 IJ SOLN
INTRAMUSCULAR | Status: DC | PRN
  Administered 2018-08-29: 10 mL

## 2018-08-29 MED ORDER — MEPERIDINE HCL 25 MG/ML IJ SOLN: 6.2500 mg | INTRAMUSCULAR | Status: DC | PRN

## 2018-08-29 MED ORDER — GLYCOPYRROLATE 0.2 MG/ML IJ SOLN
INTRAMUSCULAR | Status: DC | PRN
  Administered 2018-08-29: 0.1 mg via INTRAVENOUS

## 2018-08-29 MED ORDER — LIDOCAINE HCL (CARDIAC) PF 100 MG/5ML IV SOSY
PREFILLED_SYRINGE | INTRAVENOUS | Status: DC | PRN
  Administered 2018-08-29: 100 mg via INTRAVENOUS

## 2018-08-29 MED ORDER — MIDAZOLAM HCL 2 MG/2ML IJ SOLN
INTRAMUSCULAR | Status: DC | PRN
  Administered 2018-08-29 (×2): 1 mg via INTRAVENOUS

## 2018-08-29 MED ORDER — OXYCODONE-ACETAMINOPHEN 2.5-325 MG PO TABS: 1.0000 | ORAL_TABLET | Freq: Four times a day (QID) | ORAL | 0 refills | Status: AC | PRN

## 2018-08-29 MED ORDER — ROCURONIUM BROMIDE 100 MG/10ML IV SOLN
INTRAVENOUS | Status: DC | PRN
  Administered 2018-08-29: 35 mg via INTRAVENOUS
  Administered 2018-08-29: 10 mg via INTRAVENOUS
  Administered 2018-08-29: 5 mg via INTRAVENOUS

## 2018-08-29 MED ORDER — FENTANYL CITRATE (PF) 250 MCG/5ML IJ SOLN
INTRAMUSCULAR | Status: AC
  Filled 2018-08-29: qty 5

## 2018-08-29 MED ORDER — DEXAMETHASONE SODIUM PHOSPHATE 10 MG/ML IJ SOLN
INTRAMUSCULAR | Status: DC | PRN
  Administered 2018-08-29: 4 mg via INTRAVENOUS

## 2018-08-29 SURGICAL SUPPLY — 26 items
DERMABOND ADVANCED (GAUZE/BANDAGES/DRESSINGS) ×1
DERMABOND ADVANCED .7 DNX12 (GAUZE/BANDAGES/DRESSINGS) ×1 IMPLANT
DRSG OPSITE POSTOP 3X4 (GAUZE/BANDAGES/DRESSINGS) IMPLANT
DURAPREP 26ML APPLICATOR (WOUND CARE) ×2 IMPLANT
FORCEPS CUTTING 33CM 5MM (CUTTING FORCEPS) ×1 IMPLANT
GLOVE BIO SURGEON STRL SZ 6.5 (GLOVE) ×2 IMPLANT
GLOVE BIOGEL PI IND STRL 7.0 (GLOVE) ×3 IMPLANT
GLOVE BIOGEL PI INDICATOR 7.0 (GLOVE) ×3
GOWN STRL REUS W/TWL LRG LVL3 (GOWN DISPOSABLE) ×4 IMPLANT
HIBICLENS CHG 4% 4OZ BTL (MISCELLANEOUS) ×2 IMPLANT
NS IRRIG 1000ML POUR BTL (IV SOLUTION) ×2 IMPLANT
PACK LAPAROSCOPY BASIN (CUSTOM PROCEDURE TRAY) ×2 IMPLANT
PACK TRENDGUARD 450 HYBRID PRO (MISCELLANEOUS) IMPLANT
PROTECTOR NERVE ULNAR (MISCELLANEOUS) ×4 IMPLANT
SET IRRIG TUBING LAPAROSCOPIC (IRRIGATION / IRRIGATOR) IMPLANT
SLEEVE XCEL OPT CAN 5 100 (ENDOMECHANICALS) ×2 IMPLANT
SOLUTION ELECTROLUBE (MISCELLANEOUS) IMPLANT
SUT MNCRL AB 3-0 PS2 27 (SUTURE) ×2 IMPLANT
SUT VICRYL 0 UR6 27IN ABS (SUTURE) ×4 IMPLANT
TOWEL OR 17X24 6PK STRL BLUE (TOWEL DISPOSABLE) ×4 IMPLANT
TRAY FOLEY W/BAG SLVR 14FR (SET/KITS/TRAYS/PACK) ×2 IMPLANT
TRENDGUARD 450 HYBRID PRO PACK (MISCELLANEOUS) ×2
TROCAR BALLN 12MMX100 BLUNT (TROCAR) ×2 IMPLANT
TROCAR XCEL NON-BLD 5MMX100MML (ENDOMECHANICALS) ×2 IMPLANT
TUBING INSUF HEATED (TUBING) ×2 IMPLANT
WARMER LAPAROSCOPE (MISCELLANEOUS) ×2 IMPLANT

## 2018-08-29 NOTE — Anesthesia Postprocedure Evaluation (Signed)
Anesthesia Post Note  Patient: Susan Fleming  Procedure(s) Performed: LAPAROSCOPIC BILATERAL SALPINGECTOMY (Bilateral )     Patient location during evaluation: PACU Anesthesia Type: General Level of consciousness: awake and alert Pain management: pain level controlled Vital Signs Assessment: post-procedure vital signs reviewed and stable Respiratory status: spontaneous breathing, nonlabored ventilation, respiratory function stable and patient connected to nasal cannula oxygen Cardiovascular status: blood pressure returned to baseline and stable Postop Assessment: no apparent nausea or vomiting Anesthetic complications: no    Last Vitals:  Vitals:   08/29/18 1215 08/29/18 1230  BP: (!) 115/53 121/66  Pulse: 82 74  Resp: (!) 25 19  Temp:    SpO2: 99% 100%    Last Pain:  Vitals:   08/29/18 1230  TempSrc:   PainSc: 3    Pain Goal: Patients Stated Pain Goal: 4 (08/29/18 0933)               Farrah Skoda

## 2018-08-29 NOTE — Anesthesia Preprocedure Evaluation (Signed)
Anesthesia Evaluation  Patient identified by MRN, date of birth, ID band Patient awake    Reviewed: Allergy & Precautions, H&P , Patient's Chart, lab work & pertinent test results  Airway Mallampati: II  TM Distance: >3 FB Neck ROM: full    Dental  (+) Teeth Intact   Pulmonary    breath sounds clear to auscultation       Cardiovascular  Rhythm:regular Rate:Normal     Neuro/Psych    GI/Hepatic   Endo/Other    Renal/GU      Musculoskeletal   Abdominal   Peds  Hematology   Anesthesia Other Findings       Reproductive/Obstetrics (+) Pregnancy                             Anesthesia Physical  Anesthesia Plan  ASA: II  Anesthesia Plan: General   Post-op Pain Management:    Induction: Intravenous  PONV Risk Score and Plan: 3 and Ondansetron, Dexamethasone, Treatment may vary due to age or medical condition, Midazolam and Scopolamine patch - Pre-op  Airway Management Planned: Oral ETT  Additional Equipment:   Intra-op Plan:   Post-operative Plan: Extubation in OR  Informed Consent: I have reviewed the patients History and Physical, chart, labs and discussed the procedure including the risks, benefits and alternatives for the proposed anesthesia with the patient or authorized representative who has indicated his/her understanding and acceptance.   Dental Advisory Given  Plan Discussed with: CRNA, Anesthesiologist and Surgeon  Anesthesia Plan Comments: (  )        Anesthesia Quick Evaluation

## 2018-08-29 NOTE — Anesthesia Procedure Notes (Signed)
Procedure Name: Intubation Date/Time: 08/29/2018 10:35 AM Performed by: Flossie Dibble, CRNA Pre-anesthesia Checklist: Patient identified, Patient being monitored, Timeout performed, Emergency Drugs available and Suction available Patient Re-evaluated:Patient Re-evaluated prior to induction Oxygen Delivery Method: Circle System Utilized Preoxygenation: Pre-oxygenation with 100% oxygen Induction Type: IV induction Ventilation: Mask ventilation without difficulty Laryngoscope Size: Mac and 3 Grade View: Grade I Tube type: Oral Tube size: 7.0 mm Number of attempts: 1 Airway Equipment and Method: stylet Placement Confirmation: ETT inserted through vocal cords under direct vision,  positive ETCO2 and breath sounds checked- equal and bilateral Secured at: 21 cm Tube secured with: Tape Dental Injury: Teeth and Oropharynx as per pre-operative assessment

## 2018-08-29 NOTE — Op Note (Addendum)
  Indications: Susan Fleming is a 33 y.o. female with diagnosis of multiparity.  Pre-operative Diagnosis: Multiparity desires sterilization  Post-operative Diagnosis: same  Surgeon: [id number],JYNWGILLARD,Dalexa Gentz A   Assistants: Kathalene FramesEllis Greer.  Anesthesia: General endotracheal anesthesia   Procedure Details  The patient was seen in the Holding Room. The risks, benefits, complications, treatment options, and expected outcomes were discussed with the patient. The possibilities of reaction to medication, pulmonary aspiration, perforation of viscus, bleeding, recurrent infection, the need for additional procedures, failure to diagnose a condition, and creating a complication requiring transfusion or operation were discussed with the patient. The patient concurred with the proposed plan, giving informed consent. The patient was taken to the Operating Room, identified as [name] and the procedure verified as Diagnostic Laparoscopy with B salpingectomy. A Time Out was held and the above information confirmed.  After induction of general anesthesia, the patient was placed in modified dorsal lithotomy position where she was prepped, draped, and catheterized in the normal, sterile fashion. A foley catheter was placed..  The cervix was visualized and an intrauterine manipulator was placed. A 2 cm umbilical incision was then performed.and carried down to the fascia.  The fascia was then opened and extended bilaterally.  Peritoneum was then entered.  o vicryl was then placed around the fascia in a circumferential fashion.   The hasson was placed and ancored to the suture.Marland Kitchen.two five mm ports were placed in the right and left ower quadrants under direct visualization.   Normal pelvic anatomy was noted.   Uterus,tubes, ovaries and appendix apperared normal.   The anterior and  Posterior culdesac and liver appeared normal.   Both fallopian tubes were removed using the   Gyrus.  Hemostasis was noted.    Following the procedure the  umbilical hasson was removed after intra-abdominal carbon dioxide was expressed. The fascia was reaproximated by tying the 0 vicryl suture.   The 5mm skin incisions were closed with dermabond.  The 10 mm incision was closed with a  subcuticular suture of 3-0 monocryl. The intrauterine manipulator was then removed.  The tenaculum site was made hemostatic with pressure and silver nitrate.   Instrument, sponge, and needle counts were correct prior to abdominal closure and at the conclusion of the case.  Findings: See above.  There was some filmy adhesions seen on the left ovary to the uterus.   Estimated Blood Loss:  Minimal         Drains: none         Total IV Fluids:Intravenous fluids were administered, lactated Ringer's 1000 ml's.         Specimens: B tubes         Complications:  None; patient tolerated the procedure well.         Disposition: PACU - hemodynamically stable.         Condition: stable

## 2018-08-29 NOTE — Transfer of Care (Signed)
Immediate Anesthesia Transfer of Care Note  Patient: Susan Fleming  Procedure(s) Performed: LAPAROSCOPIC BILATERAL SALPINGECTOMY (Bilateral )  Patient Location: PACU  Anesthesia Type:General  Level of Consciousness: drowsy  Airway & Oxygen Therapy: Patient Spontanous Breathing and Patient connected to nasal cannula oxygen  Post-op Assessment: Report given to RN and Post -op Vital signs reviewed and stable  Post vital signs: Reviewed  Last Vitals:  Vitals Value Taken Time  BP    Temp    Pulse 88 08/29/2018 12:11 PM  Resp    SpO2 99 % 08/29/2018 12:11 PM  Vitals shown include unvalidated device data.  Last Pain:  Vitals:   08/29/18 0933  TempSrc: Oral  PainSc: 0-No pain      Patients Stated Pain Goal: 4 (08/29/18 0933)  Complications: No apparent anesthesia complications

## 2018-08-29 NOTE — Discharge Instructions (Signed)

## 2018-08-30 ENCOUNTER — Encounter (HOSPITAL_COMMUNITY): Payer: Self-pay | Admitting: Obstetrics and Gynecology

## 2018-09-01 NOTE — Addendum Note (Signed)
Addendum  created 09/01/18 1440 by Donney Dice D, CRNA   Charge Capture section accepted

## 2018-10-10 NOTE — H&P (Signed)
Susan T Adams33 y.o. female. Who presents for B salpingecotmy  All BC reviewed Pertinent Gynecological History: Contraception: Education given regarding options for contraception, including oral contraceptives. Blood transfusions: none Sexually transmitted diseases: none Previous GYN Procedures: none Last mammogram:  Last pap: normal Date: normal OB History: G2P2   Menstrual History: Menarche age: 33 Patient's last menstrual period was 08/26/2018.    Past Medical History:  Diagnosis Date  . Medical history non-contributory    Past Surgical History:  Procedure Laterality Date  . LAPAROSCOPIC BILATERAL SALPINGECTOMY Bilateral 08/29/2018   Procedure: LAPAROSCOPIC BILATERAL SALPINGECTOMY;  Surgeon: Jaymes Graff, MD;  Location: [facility];  Service: Gynecology;  Laterality: Bilateral;  . NO PAST SURGERIES     No current facility-administered medications for this encounter.   Current Outpatient Medications:  .  norethindrone-ethinyl estradiol (JUNEL FE 1/20) 1-20 MG-MCG tablet, Take 1 tablet by mouth at bedtime., Disp: , Rfl:  .  ibuprofen (ADVIL,MOTRIN) 600 MG tablet, Take 1 tablet (600 mg total) by mouth every 6 (six) hours as needed., Disp: 30 tablet, Rfl: 0 .  oxycodone-acetaminophen (PERCOCET) 2.5-325 MG tablet, Take 1 tablet by mouth every 6 (six) hours as needed for pain., Disp: 30 tablet, Rfl: 0 .  oxyCODONE-acetaminophen (PERCOCET) 7.5-325 MG tablet, Take 1 tablet by mouth every 6 (six) hours as needed for severe pain., Disp: 20 tablet, Rfl: 0 No Known Allergies Review of Systems - Negative except above   Physical Exam  BP (!) 124/55 (BP Location: Left Arm)   Pulse 73   Temp 97.9 F (36.6 C)   Resp 18   Ht 5\' 6"  (1.676 m)   Wt 74.8 kg   LMP 08/26/2018   SpO2 100%   BMI 26.63 kg/m  Constitutional: She appears well-developed and well-nourished.  HENT:  Head: Normocephalic.  Eyes: Pupils are equal, round, and reactive to light.  Neck: Normal range of motion. Neck  supple.  Cardiovascular: Regular rhythm.   Respiratory: Effort normal and breath sounds normal.  GI: Soft.  Genitourinary:vulva vaginal WNL Musculoskeletal: Normal range of motion.  Neurological: She is alert.  Skin: Skin is warm.  Psychiatric: She has a normal mood and affect.  Assessment/Plan: PT for B salpingectomy  Pt offered  obs vs surgery.  Pt chose surgery.  B salpngectomy Risks are but not limited to bleeding, infection, scarring of the uterus and perforation.     [name] A 07/09/2011, 11:41
